# Patient Record
Sex: Male | Born: 1975 | Race: White | Hispanic: No | Marital: Single | Smoking: Current every day smoker
Health system: Southern US, Community
[De-identification: ages and names within clinical notes are randomized; demographics above are authoritative.]

## PROBLEM LIST (undated history)

## (undated) DIAGNOSIS — I10 Essential (primary) hypertension: Secondary | ICD-10-CM

## (undated) DIAGNOSIS — L97529 Non-pressure chronic ulcer of other part of left foot with unspecified severity: Secondary | ICD-10-CM

## (undated) DIAGNOSIS — K589 Irritable bowel syndrome without diarrhea: Secondary | ICD-10-CM

## (undated) HISTORY — PX: APPENDECTOMY: SHX54

---

## 2001-07-27 ENCOUNTER — Emergency Department (HOSPITAL_COMMUNITY): Admission: EM | Admit: 2001-07-27 | Discharge: 2001-07-27 | Payer: Self-pay | Admitting: Emergency Medicine

## 2001-08-19 ENCOUNTER — Emergency Department (HOSPITAL_COMMUNITY): Admission: EM | Admit: 2001-08-19 | Discharge: 2001-08-19 | Payer: Self-pay | Admitting: *Deleted

## 2001-10-09 ENCOUNTER — Emergency Department (HOSPITAL_COMMUNITY): Admission: EM | Admit: 2001-10-09 | Discharge: 2001-10-09 | Payer: Self-pay | Admitting: Emergency Medicine

## 2002-12-19 ENCOUNTER — Encounter: Payer: Self-pay | Admitting: *Deleted

## 2002-12-19 ENCOUNTER — Emergency Department (HOSPITAL_COMMUNITY): Admission: EM | Admit: 2002-12-19 | Discharge: 2002-12-19 | Payer: Self-pay | Admitting: *Deleted

## 2008-02-23 ENCOUNTER — Emergency Department (HOSPITAL_COMMUNITY): Admission: EM | Admit: 2008-02-23 | Discharge: 2008-02-23 | Payer: Self-pay | Admitting: Emergency Medicine

## 2009-04-05 ENCOUNTER — Emergency Department (HOSPITAL_COMMUNITY): Admission: EM | Admit: 2009-04-05 | Discharge: 2009-04-05 | Payer: Self-pay | Admitting: Emergency Medicine

## 2009-12-24 ENCOUNTER — Emergency Department (HOSPITAL_COMMUNITY): Admission: EM | Admit: 2009-12-24 | Discharge: 2009-12-24 | Payer: Self-pay | Admitting: Emergency Medicine

## 2010-02-20 ENCOUNTER — Emergency Department (HOSPITAL_COMMUNITY): Admission: EM | Admit: 2010-02-20 | Discharge: 2010-02-20 | Payer: Self-pay | Admitting: Emergency Medicine

## 2010-11-27 ENCOUNTER — Emergency Department (HOSPITAL_COMMUNITY)
Admission: EM | Admit: 2010-11-27 | Discharge: 2010-11-27 | Payer: Self-pay | Source: Home / Self Care | Admitting: Emergency Medicine

## 2010-12-04 ENCOUNTER — Emergency Department (HOSPITAL_COMMUNITY)
Admission: EM | Admit: 2010-12-04 | Discharge: 2010-12-04 | Payer: Self-pay | Source: Home / Self Care | Admitting: Emergency Medicine

## 2010-12-11 ENCOUNTER — Emergency Department (HOSPITAL_COMMUNITY)
Admission: EM | Admit: 2010-12-11 | Discharge: 2010-12-11 | Payer: Self-pay | Source: Home / Self Care | Admitting: Emergency Medicine

## 2010-12-30 ENCOUNTER — Emergency Department (HOSPITAL_COMMUNITY)
Admission: EM | Admit: 2010-12-30 | Discharge: 2010-12-30 | Disposition: A | Discharge status: Home / Self Care | Payer: Self-pay | Attending: Emergency Medicine | Admitting: Emergency Medicine

## 2010-12-30 DIAGNOSIS — J019 Acute sinusitis, unspecified: Secondary | ICD-10-CM | POA: Insufficient documentation

## 2011-09-06 ENCOUNTER — Ambulatory Visit (HOSPITAL_COMMUNITY)
Admission: RE | Admit: 2011-09-06 | Discharge: 2011-09-06 | Disposition: A | Discharge status: Home / Self Care | Payer: Self-pay | Source: Ambulatory Visit | Attending: Family Medicine | Admitting: Family Medicine

## 2011-09-06 ENCOUNTER — Other Ambulatory Visit (HOSPITAL_COMMUNITY): Payer: Self-pay | Admitting: Family Medicine

## 2011-09-06 DIAGNOSIS — R159 Full incontinence of feces: Secondary | ICD-10-CM

## 2011-09-06 DIAGNOSIS — M545 Low back pain, unspecified: Secondary | ICD-10-CM | POA: Insufficient documentation

## 2011-09-06 DIAGNOSIS — M549 Dorsalgia, unspecified: Secondary | ICD-10-CM

## 2011-09-06 DIAGNOSIS — G8929 Other chronic pain: Secondary | ICD-10-CM

## 2013-05-24 ENCOUNTER — Emergency Department (HOSPITAL_COMMUNITY)
Admission: EM | Admit: 2013-05-24 | Discharge: 2013-05-24 | Discharge status: Against Medical Advice | Payer: Self-pay | Attending: Emergency Medicine | Admitting: Emergency Medicine

## 2013-05-24 ENCOUNTER — Encounter (HOSPITAL_COMMUNITY): Payer: Self-pay

## 2013-05-24 DIAGNOSIS — H538 Other visual disturbances: Secondary | ICD-10-CM | POA: Insufficient documentation

## 2013-05-24 DIAGNOSIS — F17200 Nicotine dependence, unspecified, uncomplicated: Secondary | ICD-10-CM | POA: Insufficient documentation

## 2013-05-24 DIAGNOSIS — H9210 Otorrhea, unspecified ear: Secondary | ICD-10-CM | POA: Insufficient documentation

## 2013-05-24 DIAGNOSIS — R51 Headache: Secondary | ICD-10-CM | POA: Insufficient documentation

## 2013-05-24 DIAGNOSIS — H9209 Otalgia, unspecified ear: Secondary | ICD-10-CM | POA: Insufficient documentation

## 2013-05-24 NOTE — ED Notes (Signed)
Pain and pressure on left side of head, also the same side with ear drainage for several days.

## 2013-05-24 NOTE — ED Notes (Signed)
Pt passed me in the hallway and stated "I can't wait any longer"  And walked out the e.d. Entrance.

## 2013-05-24 NOTE — ED Notes (Signed)
Pt reports head pressure & drainage from the left ear. Pt states his vision has been blurry also.

## 2013-08-20 ENCOUNTER — Emergency Department (HOSPITAL_COMMUNITY): Payer: Self-pay

## 2013-08-20 ENCOUNTER — Encounter (HOSPITAL_COMMUNITY): Payer: Self-pay | Admitting: *Deleted

## 2013-08-20 ENCOUNTER — Emergency Department (HOSPITAL_COMMUNITY)
Admission: EM | Admit: 2013-08-20 | Discharge: 2013-08-20 | Disposition: A | Discharge status: Home / Self Care | Payer: Self-pay | Attending: Emergency Medicine | Admitting: Emergency Medicine

## 2013-08-20 DIAGNOSIS — E13621 Other specified diabetes mellitus with foot ulcer: Secondary | ICD-10-CM

## 2013-08-20 DIAGNOSIS — F17200 Nicotine dependence, unspecified, uncomplicated: Secondary | ICD-10-CM | POA: Insufficient documentation

## 2013-08-20 DIAGNOSIS — E119 Type 2 diabetes mellitus without complications: Secondary | ICD-10-CM

## 2013-08-20 DIAGNOSIS — E1169 Type 2 diabetes mellitus with other specified complication: Secondary | ICD-10-CM | POA: Insufficient documentation

## 2013-08-20 DIAGNOSIS — L97509 Non-pressure chronic ulcer of other part of unspecified foot with unspecified severity: Secondary | ICD-10-CM | POA: Insufficient documentation

## 2013-08-20 LAB — CBC WITH DIFFERENTIAL/PLATELET
Eosinophils Relative: 2 % (ref 0–5)
HCT: 45.6 % (ref 39.0–52.0)
Hemoglobin: 16.4 g/dL (ref 13.0–17.0)
Lymphocytes Relative: 27 % (ref 12–46)
MCHC: 36 g/dL (ref 30.0–36.0)
MCV: 92.3 fL (ref 78.0–100.0)
Monocytes Absolute: 0.9 10*3/uL (ref 0.1–1.0)
Monocytes Relative: 11 % (ref 3–12)
Neutro Abs: 4.4 10*3/uL (ref 1.7–7.7)
WBC: 7.6 10*3/uL (ref 4.0–10.5)

## 2013-08-20 LAB — GLUCOSE, CAPILLARY: Glucose-Capillary: 252 mg/dL — ABNORMAL HIGH (ref 70–99)

## 2013-08-20 LAB — BASIC METABOLIC PANEL
BUN: 9 mg/dL (ref 6–23)
CO2: 25 mEq/L (ref 19–32)
Chloride: 100 mEq/L (ref 96–112)
Creatinine, Ser: 0.7 mg/dL (ref 0.50–1.35)

## 2013-08-20 MED ORDER — METFORMIN HCL 500 MG PO TABS: 500.0000 mg | ORAL_TABLET | Freq: Two times a day (BID) | ORAL | Status: DC

## 2013-08-20 MED ORDER — SULFAMETHOXAZOLE-TRIMETHOPRIM 800-160 MG PO TABS: 1.0000 | ORAL_TABLET | Freq: Two times a day (BID) | ORAL | Status: AC

## 2013-08-20 NOTE — ED Notes (Signed)
Pt set up for wound care and info faxed to PT.  Given diabetic info for  Classes.  Foot wrapped and placed in  Post op shoe.

## 2013-08-20 NOTE — Discharge Instructions (Signed)
Diabetes and Foot Care Diabetes may cause you to have a poor blood supply (circulation) to your legs and feet. Because of this, the skin may be thinner, break easier, and heal more slowly. You also may have nerve damage in your legs and feet causing decreased feeling. You may not notice minor injuries to your feet that could lead to serious problems or infections. Taking care of your feet is one of the most important things you can do for yourself.  HOME CARE INSTRUCTIONS  Do not go barefoot. Bare feet are easily injured.  Check your feet daily for blisters, cuts, and redness.  Wash your feet with warm water (not hot) and mild soap. Pat your feet and between your toes until completely dry.  Apply a moisturizing lotion that does not contain alcohol or petroleum jelly to the dry skin on your feet and to dry brittle toenails. Do not put it between your toes.  Trim your toenails straight across. Do not dig under them or around the cuticle.  Do not cut corns or calluses, or try to remove them with medicine.  Wear clean cotton socks or stockings every day. Make sure they are not too tight. Do not wear knee high stockings since they may decrease blood flow to your legs.  Wear leather shoes that fit properly and have enough cushioning. To break in new shoes, wear them just a few hours a day to avoid injuring your feet.  Wear shoes at all times, even in the house.  Do not cross your legs. This may decrease the blood flow to your feet.  If you find a minor scrape, cut, or break in the skin on your feet, keep it and the skin around it clean and dry. These areas may be cleansed with mild soap and water. Do not use peroxide, alcohol, iodine or Merthiolate.  When you remove an adhesive bandage, be sure not to harm the skin around it.  If you have a wound, look at it several times a day to make sure it is healing.  Do not use heating pads or hot water bottles. Burns can occur. If you have lost feeling  in your feet or legs, you may not know it is happening until it is too late.  Report any cuts, sores or bruises to your caregiver. Do not wait! SEEK MEDICAL CARE IF:   You have an injury that is not healing or you notice redness, numbness, burning, or tingling.  Your feet always feel cold.  You have pain or cramps in your legs and feet. SEEK IMMEDIATE MEDICAL CARE IF:   There is increasing redness, swelling, or increasing pain in the wound.  There is a red line that goes up your leg.  Pus is coming from a wound.  You develop an unexplained oral temperature above 102 F (38.9 C), or as your caregiver suggests.  You notice a bad smell coming from an ulcer or wound. MAKE SURE YOU:   Understand these instructions.  Will watch your condition.  Will get help right away if you are not doing well or get worse. Document Released: 10/29/2000 Document Revised: 01/24/2012 Document Reviewed: 05/07/2009 [facility] Patient Information 2014 [place], [place]. Wound Care Wound care helps prevent pain and infection.  You may need a tetanus shot if:  You cannot remember when you had your last tetanus shot.  You have never had a tetanus shot.  The injury broke your skin. If you need a tetanus shot and you choose  not to have one, you may get tetanus. Sickness from tetanus can be serious. HOME CARE   Only take medicine as told by your doctor.  Clean the wound daily with mild soap and water.  Change any bandages (dressings) as told by your doctor.  Put medicated cream and a bandage on the wound as told by your doctor.  Change the bandage if it gets wet, dirty, or starts to smell.  Take showers. Do not take baths, swim, or do anything that puts your wound under water.  Rest and raise (elevate) the wound until the pain and puffiness (swelling) are better.  Keep all doctor visits as told. GET HELP RIGHT AWAY IF:   Yellowish-white fluid (pus) comes from the wound.  Medicine does not  lessen your pain.  There is a red streak going away from the wound.  You have a fever. MAKE SURE YOU:   Understand these instructions.  Will watch your condition.  Will get help right away if you are not doing well or get worse. Document Released: 08/10/2008 Document Revised: 01/24/2012 Document Reviewed: 03/07/2011 [facility] Patient Information 2014 [place], [place]. Diabetes, Frequently Asked Questions WHAT IS DIABETES? Most of the food we eat is turned into glucose (sugar). Our bodies use it for energy. The pancreas makes a hormone called insulin. It helps glucose get into the cells of our bodies. When you have diabetes, your body either does not make enough insulin or cannot use its own insulin as well as it should. This causes sugars to build up in your blood. WHAT ARE THE SYMPTOMS OF DIABETES?  Frequent urination.  Excessive thirst.  Unexplained weight loss.  Extreme hunger.  Blurred vision.  Tingling or numbness in hands or feet.  Feeling very tired much of the time.  Dry, itchy skin.  Sores that are slow to heal.  Yeast infections. WHAT ARE THE TYPES OF DIABETES? Type 1 Diabetes   About 10% of affected people have this type.  Usually occurs before the age of 61.  Usually occurs in thin to normal weight people. Type 2 Diabetes  About 90% of affected people have this type.  Usually occurs after the age of 28.  Usually occurs in overweight people.  More likely to have:  A family history of diabetes.  A history of diabetes during pregnancy (gestational diabetes).  High blood pressure.  High cholesterol and triglycerides. Gestational Diabetes  Occurs in about 4% of pregnancies.  Usually goes away after the baby is born.  More likely to occur in women with:  Family history of diabetes.  Previous gestational diabetes.  Obese.  Over 6 years old. WHAT IS PRE-DIABETES? Pre-diabetes means your blood glucose is higher than normal, but lower  than the diabetes range. It also means you are at risk of getting type 2 diabetes and heart disease. If you are told you have pre-diabetes, have your blood glucose checked again in 1 to 2 years. WHAT IS THE TREATMENT FOR DIABETES? Treatment is aimed at keeping blood glucose near normal levels at all times. Learning how to manage this yourself is important in treating diabetes. Depending on the type of diabetes you have, your treatment will include one or more of the following:  Monitoring your blood glucose.  Meal planning.  Exercise.  Oral medicine (pills) or insulin. CAN DIABETES BE PREVENTED? With type 1 diabetes, prevention is more difficult, because the triggers that cause it are not yet known. With type 2 diabetes, prevention is more likely, with lifestyle  changes:  Maintain a healthy weight.  Eat healthy.  Exercise. IS THERE A CURE FOR DIABETES? No, there is no cure for diabetes. There is a lot of research going on that is looking for a cure, and progress is being made. Diabetes can be treated and controlled. People with diabetes can manage their diabetes and lead normal, active lives. SHOULD I BE TESTED FOR DIABETES? If you are at least 35 years old, you should be tested for diabetes. You should be tested again every 3 years. If you are 45 or older and overweight, you may want to get tested more often. If you are younger than 45, overweight, and have one or more of the following risk factors, you should be tested:  Family history of diabetes.  Inactive lifestyle.  High blood pressure. WHAT ARE SOME OTHER SOURCES FOR INFORMATION ON DIABETES? The following organizations may help in your search for more information on diabetes: National Diabetes Education Program (NDEP) Internet: SolarDiscussions.es American Diabetes Association Internet: http://www.diabetes.org  Juvenile Diabetes Foundation International Internet: WetlessWash.is Document Released:  11/04/2003 Document Revised: 01/24/2012 Document Reviewed: 08/29/2009 [facility] Patient Information 2014 [place], [place].

## 2013-08-20 NOTE — ED Notes (Signed)
Pt has wound to ball of lt foot , started as a blister, white around it and area is at least 1/4 " deep.

## 2013-08-20 NOTE — ED Provider Notes (Signed)
CSN: [id number]     Arrival date & time 08/20/13  1806 History   First MD Initiated Contact with Patient 08/20/13 1855     Chief Complaint  Patient presents with  . Wound Check   (Consider location/radiation/quality/duration/timing/severity/associated sxs/prior Treatment) Patient is a 37 y.o. male presenting with foot injury. The history is provided by the patient. No language interpreter was used.  Foot Injury Location:  Foot Time since incident:  3 weeks Injury: no   Foot location:  L foot Pain details:    Quality:  Aching   Radiates to:  Does not radiate   Severity:  Mild   Duration:  3 weeks   Timing:  Constant Chronicity:  New Dislocation: no   Foreign body present:  No foreign bodies Relieved by:  Nothing Worsened by:  Nothing tried Ineffective treatments:  None tried Pt reports he developed a blister on his foot and he now has an area that is not healing.  History reviewed. No pertinent past medical history. Past Surgical History  Procedure Laterality Date  . Appendectomy     No family history on file. History  Substance Use Topics  . Smoking status: Current Every Day Smoker  . Smokeless tobacco: Not on file  . Alcohol Use: Yes    Review of Systems  All other systems reviewed and are negative.    Allergies  Review of patient's allergies indicates no known allergies.  Home Medications   Current Outpatient Rx  Name  Route  Sig  Dispense  Refill  . ibuprofen (ADVIL,MOTRIN) 200 MG tablet   Oral   Take 400 mg by mouth every 6 (six) hours as needed for pain.           BP 140/89  Pulse 89  Temp(Src) 98.2 F (36.8 C) (Oral)  Resp 18  SpO2 99% Physical Exam  Vitals reviewed. Constitutional: He is oriented to person, place, and time. He appears well-developed and well-nourished.  HENT:  Head: Normocephalic.  Eyes: Pupils are equal, round, and reactive to light.  Cardiovascular: Normal rate.   Pulmonary/Chest: Effort normal.  Musculoskeletal: He  exhibits tenderness.  2cm round discolored area left foot below 1st toe,  No drainage, deep area approx 1cm in center,  (looks like diabetic ulcer)  Neurological: He is alert and oriented to person, place, and time.  Skin: No rash noted. No erythema.  Psychiatric: He has a normal mood and affect.    ED Course  Procedures (including critical care time) Labs Review Labs Reviewed  GLUCOSE, CAPILLARY - Abnormal; Notable for the following:    Glucose-Capillary 252 (*)    All other components within normal limits  CBC WITH DIFFERENTIAL  BASIC METABOLIC PANEL   Imaging Review Dg Foot Complete Left  08/20/2013   CLINICAL DATA:  Soft tissue ulcer on the ball of the foot.  EXAM: LEFT FOOT - COMPLETE 3+ VIEW  COMPARISON:  None.  FINDINGS: There is a hallux valgus deformity with bunion formation on the head of the 1st metatarsal. The bones are otherwise normal. There is no evidence of osteomyelitis or other acute osseous abnormality.  IMPRESSION: No acute osseous abnormality.   Electronically Signed   By: Geanie Cooley M.D.   On: 08/20/2013 19:28    MDM   1. 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 Diabetes mellitus     Pt reports no history of diabetes,  Labs show glucose of 279.   Pt started on metformin,  rx for bactrim,  Pt referred to wound care center.   Pt given referrals to primary MDs and clinics.   Wound care and post op shoe here   Elson Areas, PA-C 08/20/13 2049

## 2013-08-20 NOTE — ED Notes (Signed)
Pt states wound to bottom of left foot x ~ 3 weeks. States it first appeared as a blister and has been draining

## 2013-08-22 ENCOUNTER — Ambulatory Visit (HOSPITAL_COMMUNITY)
Admission: RE | Admit: 2013-08-22 | Discharge: 2013-08-22 | Disposition: A | Discharge status: Home / Self Care | Payer: Self-pay | Source: Ambulatory Visit | Attending: Emergency Medicine | Admitting: Emergency Medicine

## 2013-08-22 DIAGNOSIS — IMO0001 Reserved for inherently not codable concepts without codable children: Secondary | ICD-10-CM | POA: Insufficient documentation

## 2013-08-22 DIAGNOSIS — S91109A Unspecified open wound of unspecified toe(s) without damage to nail, initial encounter: Secondary | ICD-10-CM | POA: Insufficient documentation

## 2013-08-22 DIAGNOSIS — E1169 Type 2 diabetes mellitus with other specified complication: Secondary | ICD-10-CM | POA: Insufficient documentation

## 2013-08-22 DIAGNOSIS — T148XXA Other injury of unspecified body region, initial encounter: Secondary | ICD-10-CM | POA: Insufficient documentation

## 2013-08-22 DIAGNOSIS — L98492 Non-pressure chronic ulcer of skin of other sites with fat layer exposed: Secondary | ICD-10-CM

## 2013-08-22 NOTE — Progress Notes (Addendum)
Physical Therapy Evaluation  Patient Details  Name: Jeffery Robertson MRN: [id number] Date of Birth: Jan 22, 1976  Today's Date: 08/22/2013 Time: 7829-5621 PT Time Calculation (min): 45 min Charge:  evaluation             Visit#: 1 of 8  Re-eval: 09/21/13    Authorization: no insurance     Past Medical History: No past medical history on file. Past Surgical History:  Past Surgical History  Procedure Laterality Date  . Appendectomy      Subjective   Pt states he had a blister to come up on his foot that turned into a hole.  He was not going to go to the ER but was urged by family members.  Pt states he found out that he was diabetic and the MD referred him to therapy for wound care.        Problem List Patient Active Problem List   Diagnosis Date Noted  . Nonhealing nonsurgical wound 08/22/2013      Physical Therapy Wound Care Evaluation  Referring physician/Next Apt:  [place] [facility] Diagnosis:  Nonhealing diabetic wound Previous Therapy:none Current wound care treatment: Self dressing change Subjective: When did it begin: 3 weeks ago  Is patient agreeable to wound care? yes   Pain:  minimal Objective:   Wound 1:   Location:  Plantar aspect of 3rd metatarsal Length: .5 cm Width: .5 cm Depth: .6 cm Undermining: varies from .5-1.2 but undermining in all directions. Granulation 50% Slough: 50% Other: callous halo around wound that is .5 cm  Drainage (amount/description) Min serous  Dressing Used: honey; 2x2; kelix and coban Dressing: Sharps Used/Location: forcep  Remove: slough Pt given Darco boot to keep weight off of wound. PT Assessment and Plan Clinical Impression Statement: Pt is a newly diabetic who has a non-healing wound on plantar aspect of 3rd metatarsal head. Pt will benefit from skilled therapeutic intervention in order to improve on the following deficits: Other (comment) (nonhealing wound) Rehab Potential: Good PT Frequency: Min 2X/week PT  Duration: 4 weeks PT Treatment/Interventions:  (debridement dressing change) PT Plan: see for cleansing; debridement and dressing change Pt will benefit from skilled OP PT wound care to address current impairments.  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 Plan: Selective sharps debridement, Dressing Change, , Pt/family Education  Frequency/Duration:  ___2___x/week for __4___weeks.  PT Prognosis Wound Therapy - Potential for Goals: Excellent Good Fair Poor  Goals: Wound Therapy Goals - Improve the function of patient's integumentary system by progressing the wound(s) through the phases of wound healing by:  Decrease Necrotic Tissue to: 0  Decrease Necrotic Tissue - Progress: Goal set today  Increase Granulation Tissue to: 100  Increase Granulation Tissue - Progress: Goal set today  Decrease Length/Width/Depth by (cm):   .3x.3.x.5 with no undermining Decrease Length/Width/Depth - Progress: Goal set today  Improve Drainage Characteristics: scant Improve Drainage Characteristics - Progress: Goal set today  Patient/Family will be able to : I in dressing change Patient/Family Instruction Goal - Progress: Goal set today   Goals/treatment plan/discharge plan were made with and agreed upon by patient/family: Yes   Problem List Patient Active Problem List   Diagnosis Date Noted  . Nonhealing nonsurgical wound 08/22/2013

## 2013-08-22 NOTE — ED Provider Notes (Signed)
Medical screening examination/treatment/procedure(s) were performed by non-physician practitioner and as supervising physician I was immediately available for consultation/collaboration.   Benny Lennert, MD 08/22/13 [phone]

## 2013-08-24 ENCOUNTER — Ambulatory Visit (HOSPITAL_COMMUNITY)
Admission: RE | Admit: 2013-08-24 | Discharge: 2013-08-24 | Disposition: A | Discharge status: Home / Self Care | Payer: Self-pay | Source: Ambulatory Visit | Attending: Emergency Medicine | Admitting: Emergency Medicine

## 2013-08-24 DIAGNOSIS — L98492 Non-pressure chronic ulcer of skin of other sites with fat layer exposed: Secondary | ICD-10-CM

## 2013-08-24 NOTE — Progress Notes (Signed)
Physical Therapy Treatment Patient Details  Name: Jeffery Robertson MRN: [id number] Date of Birth: [birth year]  Today's Date: 08/24/2013 Time: 1150-1235 PT Time Calculation (min): 45 min  Visit#: 2 of 8  Re-eval: 09/21/13    Authorization: no insurance   Problem List Patient Active Problem List   Diagnosis Date Noted  . Nonhealing nonsurgical wound 08/22/2013      Physical Therapy Wound Care Treatment  Referring physician/Next Apt: unknown Medical Diagnosis: diabetic wound Previous Therapy: none  Subjective: It feels like my wound is healing; it's itching.  Pt states he is still getting use to the Darco boot  Is patient agreeable to wound care?  yes  Pain: none Objective:  Wound 1: Location: plantar aspect of Lt foot     Length: 1.0 x .7 wound size increasing as callous is being debrided Depth: 1.0 Width: .7 Granulation: 70% Slough: 30% Drainage (amount/description): min serous   Sharps Used/Location:   Remove: forcep; sissors  Clinical  Impressions Statement: Wound appears healthier but will be getting larger as expected from undermining and callous.    Plan:  Continue with debridement and unweighting of LE   Donnamae Jude CLT, PT  [phone]

## 2013-08-28 ENCOUNTER — Ambulatory Visit (HOSPITAL_COMMUNITY)
Admission: RE | Admit: 2013-08-28 | Discharge: 2013-08-28 | Disposition: A | Discharge status: Home / Self Care | Payer: Self-pay | Source: Ambulatory Visit | Attending: Emergency Medicine | Admitting: Emergency Medicine

## 2013-08-28 NOTE — Progress Notes (Addendum)
  Patient Details  Name: Jeffery Robertson MRN: [id number] Date of Birth: 08-Feb-1976  Today's Date: 08/28/2013 Time: 1914-7829 Visit#: 2 of 8 Re-eval: 09/21/13 Charges: Selective debridement (= or < 20 cm)  Physical Therapy Wound Care Treatment    Subjective: Pt is pain free and without complaint.  Is patient agreeable to wound care? Yes  Pain: 0/10 Charges: Selective debridement (= or < 20 cm)  Physical Therapy Wound Care Treatment    Subjective: Pt is pain free and without complaint.  Is patient agreeable to wound care? Yes  Pain: 0/10 Objective:  Wound 1:  Location: plantar aspect of Lt foot  Length: 1.0 x .7 (1010/14) Depth: 1.0 (1010/14) Width: .7 (1010/14) Granulation: 80%  Slough: 20%  Drainage (amount/description): min serous  Dressing type: Honey gel/gauze, ABD, Kerelx, Coban  Sharps Used/Location: forceps and scalpel/ 3rd metatarsal head of left foot  Remove: callous and slough   Clinical  Impressions Statement: Able to remove significant amount of slough and callous. Pt tolerates debridement well. Wound is without signs or sx of infection.     Plan:  Continue wound care per PT POC.    Seth Bake, PTA  08/28/2013, 4:59 PM

## 2013-08-30 ENCOUNTER — Ambulatory Visit (HOSPITAL_COMMUNITY)
Admission: RE | Admit: 2013-08-30 | Discharge: 2013-08-30 | Disposition: A | Discharge status: Home / Self Care | Payer: Self-pay | Source: Ambulatory Visit | Attending: Emergency Medicine | Admitting: Emergency Medicine

## 2013-08-30 NOTE — Progress Notes (Signed)
Physical Therapy Treatment Patient Details  Name: Jeffery Robertson MRN: [id number] Date of Birth: [birth year]  Today's Date: 08/30/2013 Time: 1430-1453 PT Time Calculation (min): 23 min  Visit#: 4 of 8  Re-eval: 09/21/13  Authorization: no insurance  Charges: Selective debridement (= or < 20 cm)  Physical Therapy Wound Care Treatment    Subjective: Pt is pain free and without complaint.  Is patient agreeable to wound care? Yes  Pain: 0/10 Objective:  Location: plantar aspect of Lt foot  Length: 1.0 x .7 (1010/14) Depth: 1.0 (1010/14) Width: .7 (1010/14) Granulation: 80%  Slough: 20%  Drainage (amount/description): min serous Dressing type: Honey gel/gauze, ABD, Kerelx, Coban Sharps Used/Location: forceps and scalpel/ 3rd metatarsal head of left foot Tissue Removed: callous and slough   Clinical  Impressions Statement:  Wound presents with increase granulation and less callous. Able remove significant amount of callous from wound perimeter. Pt tolerates debridement well. Wound is without signs or sx of infection.     Plan:  Continue wound care per PT POC.    Seth Bake, PTA Seth Bake Leah 08/30/2013, 3:02 PM

## 2013-09-04 ENCOUNTER — Ambulatory Visit (HOSPITAL_COMMUNITY)
Admission: RE | Admit: 2013-09-04 | Discharge: 2013-09-04 | Disposition: A | Discharge status: Home / Self Care | Payer: Self-pay | Source: Ambulatory Visit | Attending: Physical Therapy | Admitting: Physical Therapy

## 2013-09-04 DIAGNOSIS — L98492 Non-pressure chronic ulcer of skin of other sites with fat layer exposed: Secondary | ICD-10-CM

## 2013-09-04 NOTE — Progress Notes (Signed)
  Patient Details  Name: JOHNATHAN HESKETT MRM: [id number] Date of Birth: January 04, 1976  Today's Date: 09/04/2013 Time:1430  - 1452   Visit#: 5 of 8 Re-ev al: 09/21/13 Physical Therapy Wound Care Treatment  Referring physician/Next Apt:  Medical Diagnosis: diabetic non-healing wound Previous Therapy:  none Subjective:  Pt does not have anything to check his blood sugar with; pt has appointment at health department on Thursday explained to pt to let health department know that he is unable to check blood sugar and how important this is.  Is patient agreeable to wound care? Yes   Pain: none  Objective:  Wound 1  Location:  Plantar aspect of Lt 3rd metatarsal head.     Length: .7 cm Depth:  Generally 1.0 cm  But has a small area that increases to 1.5 cm  Width: .5 cm Undermining: wound undermines throughout circumference generally the size of  The callous   Granulation:  100% after debridement Slough: 0% after debridement. Drainage (amount/description): serous minimal Peri wound: calloused  Dressing Type: honey ; 2x2 gauze; Kling and coban. Sharps Used/Location: forceps; scissors Tissue Remove: slouth Clinical  Impressions Statement: Pt wound is healing more from outside than in will need to keep wound open as long as there is undermining.  Pt wound is much cleaner may be able to go down to once a week starting next week.    Plan:  Continue with wound care; decrease to once a weak  RUSSELL,CINDY 09/04/2013, 3:26 PM

## 2013-09-06 ENCOUNTER — Ambulatory Visit (HOSPITAL_COMMUNITY)
Admission: RE | Admit: 2013-09-06 | Discharge: 2013-09-06 | Disposition: A | Discharge status: Home / Self Care | Payer: Self-pay | Source: Ambulatory Visit | Attending: Emergency Medicine | Admitting: Emergency Medicine

## 2013-09-06 NOTE — Progress Notes (Signed)
Physical Therapy Treatment Patient Details  Name: Jeffery Robertson MRN: [id number] Date of Birth: June 01, 1976  Today's Date: 09/06/2013 Time: 4098-1191 PT Time Calculation (min): 18 min  Visit#: 6 of 8  Re-eval: 09/21/13  Authorization: no insurance  Charges: Selective debridement (= or < 20 cm)  Physical Therapy Wound Care Treatment  Subjective: Pt states that compression wrap felt too tight after last session. Is patient agreeable to wound care? Yes  Pain: 0/10  Objective:  Location: plantar aspect of Lt foot  Length: 1.0 x .7 (1010/14)  Depth: 1.0 (1010/14)  Width: .7 (1010/14)  Granulation: 80%  Slough: 20%  Drainage (amount/description): min serous  Dressing type: Honey gel/gauze, ABD, Kerelx, Coban  Sharps Used/Location: forceps and scalpel/ 3rd metatarsal head of left foot  Tissue Removed: callous and slough  Clinical Impressions Statement: Wound continues to progress gradually. Pt is agreeable to wound care once a week rather than twice. Able to remove good amount of slough and callous this session. Pt toelrates debridement well. Plan: Continue wound care per PT POC.     Seth Bake, PTA  09/06/2013, 3:38 PM

## 2013-09-11 ENCOUNTER — Ambulatory Visit (HOSPITAL_COMMUNITY)
Admission: RE | Admit: 2013-09-11 | Discharge: 2013-09-11 | Disposition: A | Discharge status: Home / Self Care | Payer: Self-pay | Source: Ambulatory Visit | Attending: Emergency Medicine | Admitting: Emergency Medicine

## 2013-09-11 NOTE — Progress Notes (Signed)
Physical Therapy Treatment Patient Details  Name: Jeffery Robertson MRN: [id number] Date of Birth: 10/12/1976  Today's Date: 09/11/2013 Time: 1430-1500 PT Time Calculation (min): 30 min Charges: Selective debridement (= or < 20 cm)   Visit#: 7 of 8  Re-eval: 09/21/13  Authorization: no insurance  Physical Therapy Wound Care Treatment  Subjective: Pt states that his dressing got wet in the shower. Is patient agreeable to wound care? Yes  Pain: 0/10  Objective:  Location: plantar aspect of Lt foot  Length: 1.0 x .7 (1010/14)  Depth: 1.0 (1010/14)  Width: .7 (1010/14)  Granulation: 85%  Slough: 15%  Drainage (amount/description): min serous  Dressing type: Honey gel/gauze, ABD, Kerelx, Coban  Sharps Used/Location: forceps and scalpel/ 3rd metatarsal head of left foot  Tissue Removed: callous and slough  Clinical Impressions Statement:  Wound appears to be decreasing in size. Granulation has increased. Pt tolerates debridement well. Educated pt on importance of keeping dressing clean and dry.  Plan: Reassess next session.   Seth Bake, PTA 09/11/2013, 3:15 PM

## 2013-09-13 ENCOUNTER — Ambulatory Visit (HOSPITAL_COMMUNITY): Payer: Self-pay | Admitting: Physical Therapy

## 2013-09-19 ENCOUNTER — Ambulatory Visit (HOSPITAL_COMMUNITY)
Admission: RE | Admit: 2013-09-19 | Discharge: 2013-09-19 | Disposition: A | Discharge status: Home / Self Care | Payer: Self-pay | Source: Ambulatory Visit | Attending: Emergency Medicine | Admitting: Emergency Medicine

## 2013-09-19 DIAGNOSIS — S91109A Unspecified open wound of unspecified toe(s) without damage to nail, initial encounter: Secondary | ICD-10-CM | POA: Insufficient documentation

## 2013-09-19 DIAGNOSIS — IMO0001 Reserved for inherently not codable concepts without codable children: Secondary | ICD-10-CM | POA: Insufficient documentation

## 2013-09-19 DIAGNOSIS — L98492 Non-pressure chronic ulcer of skin of other sites with fat layer exposed: Secondary | ICD-10-CM

## 2013-09-19 DIAGNOSIS — E1169 Type 2 diabetes mellitus with other specified complication: Secondary | ICD-10-CM | POA: Insufficient documentation

## 2013-09-19 NOTE — Progress Notes (Signed)
  Patient Details  Name: Jeffery Robertson MRN: [id number] Date of Birth: 01-Dec-1975  Today's Date: 09/19/2013 Time: 1440 - 1537  charge debridement Visit#: 8 of 8 Re-eval: 09/21/13 Subjective: Pt states that his dressing got wet in the shower.  Is patient agreeable to wound care? Yes  Pain: 0/10  Objective:  Location: plantar aspect of Lt foot  Length: .7 x was 1.0  Depth: .2 was 1.0 Width: .4 was .7 (1010/14)  Granulation: 95%  Slough: 05%  Drainage (amount/description): min serous  Dressing type:  Vaseline; 4x4;kling and netting Sharps Used/Location: forceps and scissors 3rd metatarsal head of left foot  Tissue Removed: callous and slough  Clinical Impressions Statement: Wound appears to be decreasing in size. Granulation has increased. Pt tolerates debridement well. Educated pt on importance of keeping dressing clean and dry.  Plan: see two more visits     Taytum Scheck,CINDY 09/19/2013, 5:16 PM

## 2013-09-26 ENCOUNTER — Ambulatory Visit (HOSPITAL_COMMUNITY)
Admission: RE | Admit: 2013-09-26 | Discharge: 2013-09-26 | Disposition: A | Discharge status: Home / Self Care | Payer: Self-pay | Source: Ambulatory Visit | Attending: Emergency Medicine | Admitting: Emergency Medicine

## 2013-09-26 NOTE — Progress Notes (Signed)
Physical Therapy Treatment Patient Details  Name: Jeffery Robertson MRN: [id number] Date of Birth: 01/03/1976  Today's Date: 09/26/2013 Time: 1445-1510 PT Time Calculation (min): 25 min Visit#: 9 of 10  Re-eval: 10/03/13 Charges:  Debridement < 20cm    Subjective: Pt states he is changing it daily and wearing his boot.  Is patient agreeable to wound care? Yes   Pain: 0/10  Objective:  Location: plantar aspect of Lt foot  Length: 0.7 cm (on 11/5) Depth: 0.2 cm ( on 11/5) Width: 0.4 cm (on 11/5)  Granulation: 95%  Slough: 05%  Drainage (amount/description):   none Dressing type: medihoney gel on 2X2, 3" conform,  netting  Sharps Used/Location: forceps and scissors 3rd metatarsal head of left foot  Tissue Removed: callous  Physical Therapy Assessment and Plan PT Assessment and Plan Clinical Impression Statement: Continues to build callous around wound preventing full closure.  Again debrided callous away to expose wound borders.  No drainage or slough in remaining wound bed.  Applied honeygel and 2X2, wrapped with conform and netting.  Pt instructed to continue wearing off loading boot to help with healing.  PT Plan: continue to see one time a week; per evaluating PT, anticipate discharge next visit.       Lurena Nida, PTA/CLT 09/26/2013, 6:33 PM

## 2013-10-03 ENCOUNTER — Ambulatory Visit (HOSPITAL_COMMUNITY)
Admission: RE | Admit: 2013-10-03 | Discharge: 2013-10-03 | Disposition: A | Discharge status: Home / Self Care | Payer: Self-pay | Source: Ambulatory Visit | Attending: Emergency Medicine | Admitting: Emergency Medicine

## 2013-10-03 NOTE — Progress Notes (Addendum)
Physical Therapy Treatment/Discharge NOTE Patient Details  Name: Jeffery Robertson MRN: [id number] Date of Birth: Nov 17, 1975  Today's Date: 10/03/2013 Time: 6295-2841 PT Time Calculation (min): 20 min Visit#: 10 of 10  Charges: Self care x 18'   Subjective: Symptoms/Limitations Symptoms: Pt states that he had a blister appear on left lateral foot from hi hunting boots.. Pain Assessment Currently in Pain?: No/denies  Precautions/Restrictions     Physical Therapy Wound Care Treatment  Subjective: Pt states that his dressing got wet in the shower.  Is patient agreeable to wound care? Yes  Pain: 0/10  Objective:  Location: plantar aspect of Lt foot  Length: Healed (measured 1.0 cm 1010/14)  Depth: Healed (measure 1.0 cm 1010/14)  Width: Healed (measured .7 cm 1010/14)    Clinical Impressions Statement: Wound on platar surface of left foot in now completely healed. Superficial blister noted in lateral side of left foot. Pt states that he got it while wearing his hunting boots. Area was cleansed. Wound was covered with saline soaked gauze and anchored with medipore. Advised pt to apply neosporin to area and keep covered with large band-aid. Advised pt to watch area for any changes and contact MD if wound does not heal properly.  Plan:  Recommend D/C.  Problem List Patient Active Problem List   Diagnosis Date Noted  . Nonhealing nonsurgical wound 08/22/2013    PT - End of Session Activity Tolerance: Patient tolerated treatment well General Behavior During Therapy: [facility] for tasks assessed/performed  Seth Bake, PTA  10/03/2013, 3:44 PM

## 2014-02-07 ENCOUNTER — Encounter (HOSPITAL_COMMUNITY): Payer: Self-pay | Admitting: Emergency Medicine

## 2014-02-07 ENCOUNTER — Emergency Department (HOSPITAL_COMMUNITY)
Admission: EM | Admit: 2014-02-07 | Discharge: 2014-02-08 | Disposition: A | Discharge status: Home / Self Care | Payer: Self-pay | Attending: Emergency Medicine | Admitting: Emergency Medicine

## 2014-02-07 DIAGNOSIS — Z79899 Other long term (current) drug therapy: Secondary | ICD-10-CM | POA: Insufficient documentation

## 2014-02-07 DIAGNOSIS — M25579 Pain in unspecified ankle and joints of unspecified foot: Secondary | ICD-10-CM | POA: Insufficient documentation

## 2014-02-07 DIAGNOSIS — I10 Essential (primary) hypertension: Secondary | ICD-10-CM | POA: Insufficient documentation

## 2014-02-07 DIAGNOSIS — L84 Corns and callosities: Secondary | ICD-10-CM | POA: Insufficient documentation

## 2014-02-07 DIAGNOSIS — M79672 Pain in left foot: Secondary | ICD-10-CM

## 2014-02-07 DIAGNOSIS — F17200 Nicotine dependence, unspecified, uncomplicated: Secondary | ICD-10-CM | POA: Insufficient documentation

## 2014-02-07 DIAGNOSIS — L97509 Non-pressure chronic ulcer of other part of unspecified foot with unspecified severity: Secondary | ICD-10-CM | POA: Insufficient documentation

## 2014-02-07 DIAGNOSIS — E1169 Type 2 diabetes mellitus with other specified complication: Secondary | ICD-10-CM | POA: Insufficient documentation

## 2014-02-07 HISTORY — DX: Essential (primary) hypertension: I10

## 2014-02-07 HISTORY — DX: Non-pressure chronic ulcer of other part of left foot with unspecified severity: L97.529

## 2014-02-07 LAB — CBG MONITORING, ED: GLUCOSE-CAPILLARY: 104 mg/dL — AB (ref 70–99)

## 2014-02-08 NOTE — ED Provider Notes (Signed)
CSN: [id number]     Arrival date & time 02/07/14  2254 History   First MD Initiated Contact with Patient 02/08/14 0004    This chart was scribed for Jeffery Boozeavid Ponciano Shealy, MD by Marica OtterNusrat Rahman, ED Scribe. This patient was seen in room APA04/APA04 and the patient's care was started at 12:06 AM.  Chief Complaint  Patient presents with  . Foot Ulcer  . Hand Pain     (Consider location/radiation/quality/duration/timing/severity/associated sxs/prior Treatment) HPI HPI Comments: Jeffery GroomsWalter R Robertson is a 38 y.o. male, with DM and HTN, who presents to the Emergency Department complaining of intermittent pain due to a left foot ulcer onset one month ago. Pt describes the pain as a dulling pain. Pt reports that he went to wound care clinic regularly until recently. Pt states the pain is aggravated by walking. Pt rates the pain a 6/7 out of 10 when he is walking. Pt denies fever or chills.   Pt is a current smoker who smokes 1.5 ppd.  Past Medical History  Diagnosis Date  . Diabetes mellitus without complication   . Hypertension   . Foot ulcer, left     secondary to DM   Past Surgical History  Procedure Laterality Date  . Appendectomy     No family history on file. History  Substance Use Topics  . Smoking status: Current Every Day Smoker  . Smokeless tobacco: Not on file  . Alcohol Use: Yes    Review of Systems    Allergies  Review of patient's allergies indicates no known allergies.  Home Medications   Current Outpatient Rx  Name  Route  Sig  Dispense  Refill  . lisinopril (PRINIVIL,ZESTRIL) 10 MG tablet   Oral   Take 10 mg by mouth daily.         Marland Kitchen. ibuprofen (ADVIL,MOTRIN) 200 MG tablet   Oral   Take 400 mg by mouth every 6 (six) hours as needed for pain.          . metFORMIN (GLUCOPHAGE) 500 MG tablet   Oral   Take 1 tablet (500 mg total) by mouth 2 (two) times daily with a meal.   60 tablet   1    BP 120/80  Pulse 94  Temp(Src) 97.8 F (36.6 C) (Oral)  Resp 16  Ht 5'  11" (1.803 m)  Wt 179 lb (81.194 kg)  BMI 24.98 kg/m2  SpO2 99% Physical Exam  Musculoskeletal:  ingrated area over the left third metarcal head 2 cm in diameter raised callous 1.2cm in diameter, no erythema or warmth    ED Course  Procedures (including critical care time) DIAGNOSTIC STUDIES: Oxygen Saturation is 99% on RA, normal by my interpretation.    COORDINATION OF CARE:  12:10 AM-Discussed treatment plan which includes (CXR, CBC panel, CMP, UA) with pt at bedside and pt agreed to plan.   Labs Review Labs Reviewed  CBG MONITORING, ED - Abnormal; Notable for the following:    Glucose-Capillary 104 (*)    All other components within normal limits   MDM   Final diagnoses:  Pain in left foot    Painful lesion of the left foot which apparently had started out as an ulcer but now is hyperkeratotic and protuberant. Old records are reviewed confirming treatment for the ulcer in that location with recurrent hyperkeratotic lesions. This will likely need to be excised the this would need to be done by a podiatrist. He is referred back to podiatry. He is advised to use  mole skin to decrease pressure on the lesion.  I personally performed the services described in this documentation, which was scribed in my presence. The recorded information has been reviewed and is accurate.    Jeffery Booze, MD 02/08/14 [phone]

## 2014-02-08 NOTE — Discharge Instructions (Signed)
Use warm soaks.  Apply mole skin to keep pressure off the sore area.  Make an appointment with the foot doctor.

## 2014-02-08 NOTE — ED Notes (Signed)
Pt alert & oriented x4, stable gait. Patient given discharge instructions, paperwork & prescription(s). Patient  instructed to stop at the registration desk to finish any additional paperwork. Patient verbalized understanding. Pt left department w/ no further questions. 

## 2014-03-15 ENCOUNTER — Emergency Department (HOSPITAL_COMMUNITY)
Admission: EM | Admit: 2014-03-15 | Discharge: 2014-03-15 | Disposition: A | Discharge status: Home / Self Care | Payer: Self-pay | Attending: Emergency Medicine | Admitting: Emergency Medicine

## 2014-03-15 ENCOUNTER — Encounter (HOSPITAL_COMMUNITY): Payer: Self-pay | Admitting: Emergency Medicine

## 2014-03-15 ENCOUNTER — Emergency Department (HOSPITAL_COMMUNITY): Payer: Self-pay

## 2014-03-15 DIAGNOSIS — F17200 Nicotine dependence, unspecified, uncomplicated: Secondary | ICD-10-CM | POA: Insufficient documentation

## 2014-03-15 DIAGNOSIS — T733XXA Exhaustion due to excessive exertion, initial encounter: Secondary | ICD-10-CM | POA: Insufficient documentation

## 2014-03-15 DIAGNOSIS — Y929 Unspecified place or not applicable: Secondary | ICD-10-CM | POA: Insufficient documentation

## 2014-03-15 DIAGNOSIS — IMO0002 Reserved for concepts with insufficient information to code with codable children: Secondary | ICD-10-CM | POA: Insufficient documentation

## 2014-03-15 DIAGNOSIS — Z79899 Other long term (current) drug therapy: Secondary | ICD-10-CM | POA: Insufficient documentation

## 2014-03-15 DIAGNOSIS — S46911A Strain of unspecified muscle, fascia and tendon at shoulder and upper arm level, right arm, initial encounter: Secondary | ICD-10-CM

## 2014-03-15 DIAGNOSIS — Z872 Personal history of diseases of the skin and subcutaneous tissue: Secondary | ICD-10-CM | POA: Insufficient documentation

## 2014-03-15 DIAGNOSIS — E119 Type 2 diabetes mellitus without complications: Secondary | ICD-10-CM | POA: Insufficient documentation

## 2014-03-15 DIAGNOSIS — I10 Essential (primary) hypertension: Secondary | ICD-10-CM | POA: Insufficient documentation

## 2014-03-15 DIAGNOSIS — Y9389 Activity, other specified: Secondary | ICD-10-CM | POA: Insufficient documentation

## 2014-03-15 NOTE — Discharge Instructions (Signed)
Your x-ray is negative for any fracture or dislocation. You do have a small spur on your ulnar bone. Use Tylenol or ibuprofen for soreness. Please see Dr. Romeo AppleHarrison for additional evaluation if discomfort continues.

## 2014-03-15 NOTE — ED Provider Notes (Signed)
CSN: [id number]     Arrival date & time 03/15/14  1243 History   First MD Initiated Contact with Patient 03/15/14 1305     Chief Complaint  Patient presents with  . Arm Pain     (Consider location/radiation/quality/duration/timing/severity/associated sxs/prior Treatment) HPI Comments: Patient is a 38 year old male who presents to the emergency department with right arm pain. The patient states that on yesterday April 30, he was punching a punching bag. He felt a pop in the posterior portion of his lower arm near the elbow. He states he had a very sharp pain at that time. He later noticed that there was a small knot on the posterior portion of his forearm. He states that today he does not have much pain in the area, but he was concerned about the small raised area following the events of yesterday. The patient has not had any previous operations or procedures involving the right arm.  Patient is a 38 y.o. male presenting with arm pain. The history is provided by the patient.  Arm Pain Pertinent negatives include no abdominal pain, arthralgias, chest pain, coughing or neck pain.    Past Medical History  Diagnosis Date  . Diabetes mellitus without complication   . Hypertension   . Foot ulcer, left     secondary to DM   Past Surgical History  Procedure Laterality Date  . Appendectomy     History reviewed. No pertinent family history. History  Substance Use Topics  . Smoking status: Current Every Day Smoker  . Smokeless tobacco: Not on file  . Alcohol Use: Yes     Comment: 4times a week/beer    Review of Systems  Constitutional: Negative for activity change.       All ROS Neg except as noted in HPI  HENT: Negative for nosebleeds.   Eyes: Negative for photophobia and discharge.  Respiratory: Negative for cough, shortness of breath and wheezing.   Cardiovascular: Negative for chest pain and palpitations.  Gastrointestinal: Negative for abdominal pain and blood in stool.   Genitourinary: Negative for dysuria, frequency and hematuria.  Musculoskeletal: Negative for arthralgias, back pain and neck pain.  Skin: Negative.   Neurological: Negative for dizziness, seizures and speech difficulty.  Psychiatric/Behavioral: Negative for hallucinations and confusion.      Allergies  Review of patient's allergies indicates no known allergies.  Home Medications   Prior to Admission medications   Medication Sig Start Date End Date Taking? Authorizing Provider  ibuprofen (ADVIL,MOTRIN) 200 MG tablet Take 400 mg by mouth every 6 (six) hours as needed for pain.    Yes Historical Provider, MD  lisinopril (PRINIVIL,ZESTRIL) 10 MG tablet Take 10 mg by mouth daily.   Yes Historical Provider, MD  metFORMIN (GLUCOPHAGE) 500 MG tablet Take 1 tablet (500 mg total) by mouth 2 (two) times daily with a meal. 08/20/13  Yes Lonia SkinnerLeslie K Sofia, PA-C   BP 120/90  Pulse 98  Temp(Src) 98.3 F (36.8 C) (Oral)  Resp 18  SpO2 100% Physical Exam  Nursing note and vitals reviewed. Constitutional: He is oriented to person, place, and time. He appears well-developed and well-nourished.  Non-toxic appearance.  HENT:  Head: Normocephalic.  Right Ear: Tympanic membrane and external ear normal.  Left Ear: Tympanic membrane and external ear normal.  Eyes: EOM and lids are normal. Pupils are equal, round, and reactive to light.  Neck: Normal range of motion. Neck supple. Carotid bruit is not present.  Cardiovascular: Normal rate, regular rhythm, normal heart sounds,  intact distal pulses and normal pulses.   Pulmonary/Chest: Breath sounds normal. No respiratory distress.  Abdominal: Soft. Bowel sounds are normal. There is no tenderness. There is no guarding.  Musculoskeletal: Normal range of motion.  There is full range of motion of the right shoulder and elbow. There is a small raised area just below the elbow on the ulnar surface of the forearm. This area is not hot, is not tender, there is no  red streaks related to it. There is no significant swelling. There is full range of motion of the right wrist and fingers. The capillary refill is less than 2 seconds. The radial pulse is 2+. There's no deformity of the bicep or tricep area. No palpable hematoma noted.  Lymphadenopathy:       Head (right side): No submandibular adenopathy present.       Head (left side): No submandibular adenopathy present.    He has no cervical adenopathy.  Neurological: He is alert and oriented to person, place, and time. He has normal strength. No cranial nerve deficit or sensory deficit.  Skin: Skin is warm and dry.  Psychiatric: He has a normal mood and affect. His speech is normal.    ED Course  Procedures (including critical care time) Labs Review Labs Reviewed - No data to display  Imaging Review No results found.   EKG Interpretation None      MDM X-ray of the right elbow shows a spur on the proximal ulnar. The x-ray is otherwise negative. The patient has been given the information from the examination as well as the x-ray. Suspect that the patient has a strain of the elbow area following a vigorous workout and punching the punching bag. Patient advised to use Tylenol or ibuprofen for soreness. He will see orthopedics if not improving.    Final diagnoses:  None    **I have reviewed nursing notes, vital signs, and all appropriate lab and imaging results for this patient.Kathie Dike*    Jarquis Walker M Tayt Moyers, PA-C 03/15/14 [phone]

## 2014-03-15 NOTE — ED Notes (Signed)
Rt arm pain, punched a punching bag yesterday with his fist, felt a pop and now has tender area app 3" below elbow .  No pain when just sitting. And not using his arm.  Good radial pulse and distal sensation

## 2014-03-15 NOTE — ED Notes (Signed)
Pt was hitting punching bag and heard at pop to right posterior lower arm. C/o swelling and pain now. Nad. Slight swelling noted. Radial pulses present

## 2014-03-18 NOTE — ED Provider Notes (Signed)
Medical screening examination/treatment/procedure(s) were performed by non-physician practitioner and as supervising physician I was immediately available for consultation/collaboration.   EKG Interpretation None        Eastyn Skalla L Wallace Cogliano, MD 03/18/14 1513 

## 2014-04-11 ENCOUNTER — Emergency Department (HOSPITAL_COMMUNITY)
Admission: EM | Admit: 2014-04-11 | Discharge: 2014-04-11 | Disposition: A | Discharge status: Home / Self Care | Payer: Self-pay | Attending: Emergency Medicine | Admitting: Emergency Medicine

## 2014-04-11 ENCOUNTER — Emergency Department (HOSPITAL_COMMUNITY): Payer: Self-pay

## 2014-04-11 ENCOUNTER — Encounter (HOSPITAL_COMMUNITY): Payer: Self-pay | Admitting: Emergency Medicine

## 2014-04-11 DIAGNOSIS — Y9389 Activity, other specified: Secondary | ICD-10-CM | POA: Insufficient documentation

## 2014-04-11 DIAGNOSIS — E119 Type 2 diabetes mellitus without complications: Secondary | ICD-10-CM | POA: Insufficient documentation

## 2014-04-11 DIAGNOSIS — R509 Fever, unspecified: Secondary | ICD-10-CM

## 2014-04-11 DIAGNOSIS — Z760 Encounter for issue of repeat prescription: Secondary | ICD-10-CM

## 2014-04-11 DIAGNOSIS — S90569A Insect bite (nonvenomous), unspecified ankle, initial encounter: Secondary | ICD-10-CM | POA: Insufficient documentation

## 2014-04-11 DIAGNOSIS — Z872 Personal history of diseases of the skin and subcutaneous tissue: Secondary | ICD-10-CM | POA: Insufficient documentation

## 2014-04-11 DIAGNOSIS — R5381 Other malaise: Secondary | ICD-10-CM | POA: Insufficient documentation

## 2014-04-11 DIAGNOSIS — Z79899 Other long term (current) drug therapy: Secondary | ICD-10-CM | POA: Insufficient documentation

## 2014-04-11 DIAGNOSIS — I10 Essential (primary) hypertension: Secondary | ICD-10-CM | POA: Insufficient documentation

## 2014-04-11 DIAGNOSIS — R000 Tachycardia, unspecified: Secondary | ICD-10-CM | POA: Insufficient documentation

## 2014-04-11 DIAGNOSIS — W57XXXA Bitten or stung by nonvenomous insect and other nonvenomous arthropods, initial encounter: Secondary | ICD-10-CM

## 2014-04-11 DIAGNOSIS — F17200 Nicotine dependence, unspecified, uncomplicated: Secondary | ICD-10-CM | POA: Insufficient documentation

## 2014-04-11 DIAGNOSIS — R5383 Other fatigue: Secondary | ICD-10-CM

## 2014-04-11 DIAGNOSIS — R51 Headache: Secondary | ICD-10-CM | POA: Insufficient documentation

## 2014-04-11 DIAGNOSIS — Y929 Unspecified place or not applicable: Secondary | ICD-10-CM | POA: Insufficient documentation

## 2014-04-11 LAB — BASIC METABOLIC PANEL
BUN: 12 mg/dL (ref 6–23)
CALCIUM: 9.1 mg/dL (ref 8.4–10.5)
CO2: 25 mEq/L (ref 19–32)
Chloride: 97 mEq/L (ref 96–112)
Creatinine, Ser: 1.12 mg/dL (ref 0.50–1.35)
GFR, EST NON AFRICAN AMERICAN: 84 mL/min — AB (ref >90)
Glucose, Bld: 111 mg/dL — ABNORMAL HIGH (ref 70–99)
POTASSIUM: 4.3 meq/L (ref 3.7–5.3)
SODIUM: 135 meq/L — AB (ref 137–147)

## 2014-04-11 LAB — CBC WITH DIFFERENTIAL/PLATELET
BASOS PCT: 1 % (ref 0–1)
Basophils Absolute: 0 10*3/uL (ref 0.0–0.1)
Eosinophils Absolute: 0 10*3/uL (ref 0.0–0.7)
Eosinophils Relative: 0 % (ref 0–5)
HCT: 46.9 % (ref 39.0–52.0)
HEMOGLOBIN: 16.9 g/dL (ref 13.0–17.0)
LYMPHS ABS: 0.7 10*3/uL (ref 0.7–4.0)
Lymphocytes Relative: 10 % — ABNORMAL LOW (ref 12–46)
MCH: 33.5 pg (ref 26.0–34.0)
MCHC: 36 g/dL (ref 30.0–36.0)
MCV: 92.9 fL (ref 78.0–100.0)
MONOS PCT: 7 % (ref 3–12)
Monocytes Absolute: 0.5 10*3/uL (ref 0.1–1.0)
NEUTROS ABS: 5.8 10*3/uL (ref 1.7–7.7)
NEUTROS PCT: 82 % — AB (ref 43–77)
PLATELETS: 102 10*3/uL — AB (ref 150–400)
RBC: 5.05 MIL/uL (ref 4.22–5.81)
RDW: 13.1 % (ref 11.5–15.5)
SMEAR REVIEW: DECREASED
WBC: 7.1 10*3/uL (ref 4.0–10.5)

## 2014-04-11 MED ORDER — METFORMIN HCL 500 MG PO TABS: 500.0000 mg | ORAL_TABLET | Freq: Two times a day (BID) | ORAL | Status: DC

## 2014-04-11 MED ORDER — HYDROCODONE-ACETAMINOPHEN 5-325 MG PO TABS: 1.0000 | ORAL_TABLET | ORAL | Status: DC | PRN

## 2014-04-11 MED ORDER — LISINOPRIL 10 MG PO TABS: 10.0000 mg | ORAL_TABLET | Freq: Every day | ORAL | Status: DC

## 2014-04-11 MED ORDER — DOXYCYCLINE HYCLATE 100 MG PO CAPS: 100.0000 mg | ORAL_CAPSULE | Freq: Two times a day (BID) | ORAL | Status: DC

## 2014-04-11 NOTE — Discharge Instructions (Signed)
Fever, Adult A fever is a higher than normal body temperature. In an adult, an oral temperature around 98.6 F (37 C) is considered normal. A temperature of 100.4 F (38 C) or higher is generally considered a fever. Mild or moderate fevers generally have no long-term effects and often do not require treatment. Extreme fever (greater than or equal to 106 F or 41.1 C) can cause seizures. The sweating that may occur with repeated or prolonged fever may cause dehydration. Elderly people can develop confusion during a fever. A measured temperature can vary with:  Age.  Time of day.  Method of measurement (mouth, underarm, rectal, or ear). The fever is confirmed by taking a temperature with a thermometer. Temperatures can be taken different ways. Some methods are accurate and some are not.  An oral temperature is used most commonly. Electronic thermometers are fast and accurate.  An ear temperature will only be accurate if the thermometer is positioned as recommended by the manufacturer.  A rectal temperature is accurate and done for those adults who have a condition where an oral temperature cannot be taken.  An underarm (axillary) temperature is not accurate and not recommended. Fever is a symptom, not a disease.  CAUSES   Infections commonly cause fever.  Some noninfectious causes for fever include:  Some arthritis conditions.  Some thyroid or adrenal gland conditions.  Some immune system conditions.  Some types of cancer.  A medicine reaction.  High doses of certain street drugs such as methamphetamine.  Dehydration.  Exposure to high outside or room temperatures.  Occasionally, the source of a fever cannot be determined. This is sometimes called a "fever of unknown origin" (FUO).  Some situations may lead to a temporary rise in body temperature that may go away on its own. Examples are:  Childbirth.  Surgery.  Intense exercise. HOME CARE INSTRUCTIONS   Take  appropriate medicines for fever. Follow dosing instructions carefully. If you use acetaminophen to reduce the fever, be careful to avoid taking other medicines that also contain acetaminophen. Do not take aspirin for a fever if you are younger than age 59. There is an association with Reye's syndrome. Reye's syndrome is a rare but potentially deadly disease.  If an infection is present and antibiotics have been prescribed, take them as directed. Finish them even if you start to feel better.  Rest as needed.  Maintain an adequate fluid intake. To prevent dehydration during an illness with prolonged or recurrent fever, you may need to drink extra fluid.Drink enough fluids to keep your urine clear or pale yellow.  Sponging or bathing with room temperature water may help reduce body temperature. Do not use ice water or alcohol sponge baths.  Dress comfortably, but do not over-bundle. SEEK MEDICAL CARE IF:   You are unable to keep fluids down.  You develop vomiting or diarrhea.  You are not feeling at least partly better after 3 days.  You develop new symptoms or problems. SEEK IMMEDIATE MEDICAL CARE IF:   You have shortness of breath or trouble breathing.  You develop excessive weakness.  You are dizzy or you faint.  You are extremely thirsty or you are making little or no urine.  You develop new pain that was not there before (such as in the head, neck, chest, back, or abdomen).  You have persistant vomiting and diarrhea for more than 1 to 2 days.  You develop a stiff neck or your eyes become sensitive to light.  You develop a  skin rash.  You have a fever or persistent symptoms for more than 2 to 3 days.  You have a fever and your symptoms suddenly get worse. MAKE SURE YOU:   Understand these instructions.  Will watch your condition.  Will get help right away if you are not doing well or get worse. Document Released: 04/27/2001 Document Revised: 01/24/2012 Document  Reviewed: 09/02/2011 [facility] Patient Information 2014 [place].  Tick Bite Information Ticks are insects that attach themselves to the skin and draw blood for food. There are various types of ticks. Common types include wood ticks and deer ticks. Most ticks live in shrubs and grassy areas. Ticks can climb onto your body when you make contact with leaves or grass where the tick is waiting. The most common places on the body for ticks to attach themselves are the scalp, neck, armpits, waist, and groin. Most tick bites are harmless, but sometimes ticks carry germs that cause diseases. These germs can be spread to a person during the tick's feeding process. The chance of a disease spreading through a tick bite depends on:   The type of tick.  Time of year.   How long the tick is attached.   Geographic location.  HOW CAN YOU PREVENT TICK BITES? Take these steps to help prevent tick bites when you are outdoors:  Wear protective clothing. Long sleeves and long pants are best.   Wear white clothes so you can see ticks more easily.  Tuck your pant legs into your socks.   If walking on a trail, stay in the middle of the trail to avoid brushing against bushes.  Avoid walking through areas with long grass.  Put insect repellent on all exposed skin and along boot tops, pant legs, and sleeve cuffs.   Check clothing, hair, and skin repeatedly and before going inside.   Brush off any ticks that are not attached.  Take a shower or bath as soon as possible after being outdoors.  WHAT IS THE PROPER WAY TO REMOVE A TICK? Ticks should be removed as soon as possible to help prevent diseases caused by tick bites. 1. If latex gloves are available, put them on before trying to remove a tick.  2. Using fine-point tweezers, grasp the tick as close to the skin as possible. You may also use curved forceps or a tick removal tool. Grasp the tick as close to its head as possible. Avoid  grasping the tick on its body. 3. Pull gently with steady upward pressure until the tick lets go. Do not twist the tick or jerk it suddenly. This may break off the tick's head or mouth parts. 4. Do not squeeze or crush the tick's body. This could force disease-carrying fluids from the tick into your body.  5. After the tick is removed, wash the bite area and your hands with soap and water or other disinfectant such as alcohol. 6. Apply a small amount of antiseptic cream or ointment to the bite site.  7. Wash and disinfect any instruments that were used.  Do not try to remove a tick by applying a hot match, petroleum jelly, or fingernail polish to the tick. These methods do not work and may increase the chances of disease being spread from the tick bite.  WHEN SHOULD YOU SEEK MEDICAL CARE? Contact your health care provider if you are unable to remove a tick from your skin or if a part of the tick breaks off and is stuck in the  skin.  After a tick bite, you need to be aware of signs and symptoms that could be related to diseases spread by ticks. Contact your health care provider if you develop any of the following in the days or weeks after the tick bite:  Unexplained fever.  Rash. A circular rash that appears days or weeks after the tick bite may indicate the possibility of Lyme disease. The rash may resemble a target with a bull's-eye and may occur at a different part of your body than the tick bite.  Redness and swelling in the area of the tick bite.   Tender, swollen lymph glands.   Diarrhea.   Weight loss.   Cough.   Fatigue.   Muscle, joint, or bone pain.   Abdominal pain.   Headache.   Lethargy or a change in your level of consciousness.  Difficulty walking or moving your legs.   Numbness in the legs.   Paralysis.  Shortness of breath.   Confusion.   Repeated vomiting.  Document Released: 10/29/2000 Document Revised: 08/22/2013 Document Reviewed:  04/11/2013 [facility] Patient Information 2014 [place], [place].   Take the entire course of doxycycline.  Take ibuprofen 600 mg up to 4 times daily if needed for aches and headache.  You may use the hydrocodone if needed for additional pain relief.  Use caution as this medication as it will make you drowsy.  Return here or see your Dr. for recheck if your symptoms persist or worsen in any way.  Your lab tests and chest x-ray normal today.  You are being treated for your recent tick exposure with this medication.

## 2014-04-11 NOTE — ED Notes (Signed)
Pt complain of fever and chills

## 2014-04-12 LAB — ROCKY MTN SPOTTED FVR AB, IGG-BLOOD: RMSF IGG: 0.22 IV

## 2014-04-12 NOTE — ED Provider Notes (Signed)
Medical screening examination/treatment/procedure(s) were performed by non-physician practitioner and as supervising physician I was immediately available for consultation/collaboration.   EKG Interpretation None        Kemberly Taves, MD 04/12/14 1542 

## 2014-04-12 NOTE — ED Provider Notes (Signed)
CSN: [id number]     Arrival date & time 04/11/14  [id number] History   First MD Initiated Contact with Patient 04/11/14 1117     Chief Complaint  Patient presents with  . Tick Removal     (Consider location/radiation/quality/duration/timing/severity/associated sxs/prior Treatment) HPI Comments: Jeffery Robertson is a 38 y.o. Male with complaints of fever, chills and arthralgias.  He describes discovering a tick embedded and fairly engorged on his right posterior upper thigh which he removed last week.  Over the past 2 days he has had complaint of fevers, chills, mild headache and aching in "all his joints".  He denies rash, nausea, vomiting, neck pain or stiffness, photophobia or other visual changes, sore throat, cough or shortness of breath, chest pain, abdominal pain, diarrhea and dysuria.  He has taken ibuprofen with transient relief.  Wife at bedside also states he has been out of his metformin and lisinopril for the past month and believes his symptoms may be related to this.  He has not checked his cbg recently.  He has been maintaining fluid intake, but has decreased appetite over the past 2 days.     The history is provided by the patient and the spouse.    Past Medical History  Diagnosis Date  . Diabetes mellitus without complication   . Hypertension   . Foot ulcer, left     secondary to DM   Past Surgical History  Procedure Laterality Date  . Appendectomy     No family history on file. History  Substance Use Topics  . Smoking status: Current Every Day Smoker  . Smokeless tobacco: Not on file  . Alcohol Use: Yes     Comment: 4times a week/beer    Review of Systems  Constitutional: Positive for fever, chills and fatigue.  HENT: Negative for congestion and sore throat.   Eyes: Negative.  Negative for photophobia and visual disturbance.  Respiratory: Negative for cough, chest tightness and shortness of breath.   Cardiovascular: Negative for chest pain.  Gastrointestinal:  Negative for nausea, vomiting and abdominal pain.  Genitourinary: Negative.   Musculoskeletal: Positive for arthralgias. Negative for joint swelling and neck pain.  Skin: Negative.  Negative for color change, rash and wound.  Neurological: Positive for headaches. Negative for dizziness, weakness, light-headedness and numbness.  Psychiatric/Behavioral: Negative.       Allergies  Review of patient's allergies indicates no known allergies.  Home Medications   Prior to Admission medications   Medication Sig Start Date End Date Taking? Authorizing Provider  Aromatic Inhalants (INHALER DECONGESTANT IN) Inhale 2 puffs into the lungs as needed (shortness of breath).   Yes Historical Provider, MD  ibuprofen (ADVIL,MOTRIN) 200 MG tablet Take 400 mg by mouth every 6 (six) hours as needed for pain.    Yes Historical Provider, MD  doxycycline (VIBRAMYCIN) 100 MG capsule Take 1 capsule (100 mg total) by mouth 2 (two) times daily. 04/11/14   Burgess AmorJulie Dera Vanaken, PA-C  HYDROcodone-acetaminophen (NORCO/VICODIN) 5-325 MG per tablet Take 1 tablet by mouth every 4 (four) hours as needed for moderate pain. 04/11/14   Burgess AmorJulie Ercia Crisafulli, PA-C  lisinopril (PRINIVIL,ZESTRIL) 10 MG tablet Take 1 tablet (10 mg total) by mouth daily. 04/11/14   Burgess AmorJulie Brevyn Ring, PA-C  metFORMIN (GLUCOPHAGE) 500 MG tablet Take 1 tablet (500 mg total) by mouth 2 (two) times daily with a meal. 04/11/14   Burgess AmorJulie Mabel Unrein, PA-C   BP 119/77  Pulse 107  Temp(Src) 98.6 F (37 C)  Resp 18  Wt 180  lb (81.647 kg)  SpO2 100% Physical Exam  Nursing note and vitals reviewed. Constitutional: He appears well-developed and well-nourished.  HENT:  Head: Normocephalic and atraumatic.  Eyes: Conjunctivae are normal.  Neck: Normal range of motion.  Cardiovascular: Normal rate, regular rhythm, normal heart sounds and intact distal pulses.   Borderline tachycardic.  Pulmonary/Chest: Effort normal and breath sounds normal. He has no wheezes.  Abdominal: Soft. Bowel sounds  are normal. He exhibits no distension. There is no tenderness.  Musculoskeletal: Normal range of motion.  Neurological: He is alert.  Skin: Skin is warm and dry. No rash noted. No erythema.  No visible lesion or bulls eye at site of tick removal. No evidence of retained fb.  Psychiatric: He has a normal mood and affect.    ED Course  Procedures (including critical care time) Labs Review Labs Reviewed  BASIC METABOLIC PANEL - Abnormal; Notable for the following:    Sodium 135 (*)    Glucose, Bld 111 (*)    GFR calc non Af Amer 84 (*)    All other components within normal limits  CBC WITH DIFFERENTIAL - Abnormal; Notable for the following:    Platelets 102 (*)    Neutrophils Relative % 82 (*)    Lymphocytes Relative 10 (*)    All other components within normal limits  ROCKY MTN SPOTTED FVR AB, IGG-BLOOD    Imaging Review Dg Chest 2 View  04/11/2014   CLINICAL DATA:  Chills, body aches and fatigue. History of recent tick removal.  EXAM: CHEST - 2 VIEW  COMPARISON:  12/24/2009  FINDINGS: The heart size and mediastinal contours are within normal limits. There is no evidence of pulmonary edema, consolidation, pneumothorax, nodule or pleural fluid. Stable scoliosis.  IMPRESSION: No active disease.   Electronically Signed   By: Irish Lack M.D.   On: 04/11/2014 11:28     EKG Interpretation None      MDM   Final diagnoses:  Tick bite  Fever  Medication refill    Pt with sx suspicious for possible tick borne infection.  Low Na+ and platelets fit RMSF possibility.  He was placed on doxycycline 100 mg bid x 14 days.  Stressed need to complete the full course. Advised he needs a recheck by his pcp (establishing care with health dept within 1 month) or return here for any worsened sx.  Tylenol or motrin for fever, body aches.  Tick titers ordered prior to dc home.  Pt also requested medication refills.  Discussed that his cbg and bp normal today.  Will refill, but advised close watch  on cbgs.      Burgess Amor, PA-C 04/12/14 1143

## 2014-05-23 ENCOUNTER — Emergency Department (HOSPITAL_COMMUNITY)
Admission: EM | Admit: 2014-05-23 | Discharge: 2014-05-23 | Disposition: A | Discharge status: Home / Self Care | Payer: Self-pay | Attending: Emergency Medicine | Admitting: Emergency Medicine

## 2014-05-23 ENCOUNTER — Encounter (HOSPITAL_COMMUNITY): Payer: Self-pay | Admitting: Emergency Medicine

## 2014-05-23 ENCOUNTER — Emergency Department (HOSPITAL_COMMUNITY): Payer: Self-pay

## 2014-05-23 DIAGNOSIS — IMO0002 Reserved for concepts with insufficient information to code with codable children: Secondary | ICD-10-CM | POA: Insufficient documentation

## 2014-05-23 DIAGNOSIS — F17200 Nicotine dependence, unspecified, uncomplicated: Secondary | ICD-10-CM | POA: Insufficient documentation

## 2014-05-23 DIAGNOSIS — S9031XA Contusion of right foot, initial encounter: Secondary | ICD-10-CM

## 2014-05-23 DIAGNOSIS — Y9389 Activity, other specified: Secondary | ICD-10-CM | POA: Insufficient documentation

## 2014-05-23 DIAGNOSIS — L97509 Non-pressure chronic ulcer of other part of unspecified foot with unspecified severity: Secondary | ICD-10-CM | POA: Insufficient documentation

## 2014-05-23 DIAGNOSIS — Z791 Long term (current) use of non-steroidal anti-inflammatories (NSAID): Secondary | ICD-10-CM | POA: Insufficient documentation

## 2014-05-23 DIAGNOSIS — Y929 Unspecified place or not applicable: Secondary | ICD-10-CM | POA: Insufficient documentation

## 2014-05-23 DIAGNOSIS — S9030XA Contusion of unspecified foot, initial encounter: Secondary | ICD-10-CM | POA: Insufficient documentation

## 2014-05-23 DIAGNOSIS — E1169 Type 2 diabetes mellitus with other specified complication: Secondary | ICD-10-CM | POA: Insufficient documentation

## 2014-05-23 DIAGNOSIS — Z79899 Other long term (current) drug therapy: Secondary | ICD-10-CM | POA: Insufficient documentation

## 2014-05-23 DIAGNOSIS — R000 Tachycardia, unspecified: Secondary | ICD-10-CM | POA: Insufficient documentation

## 2014-05-23 DIAGNOSIS — I10 Essential (primary) hypertension: Secondary | ICD-10-CM | POA: Insufficient documentation

## 2014-05-23 LAB — CBC WITH DIFFERENTIAL/PLATELET
BASOS ABS: 0 10*3/uL (ref 0.0–0.1)
BASOS PCT: 0 % (ref 0–1)
Eosinophils Absolute: 0 10*3/uL (ref 0.0–0.7)
Eosinophils Relative: 0 % (ref 0–5)
HEMATOCRIT: 43.3 % (ref 39.0–52.0)
HEMOGLOBIN: 15.3 g/dL (ref 13.0–17.0)
LYMPHS PCT: 12 % (ref 12–46)
Lymphs Abs: 1.6 10*3/uL (ref 0.7–4.0)
MCH: 33.1 pg (ref 26.0–34.0)
MCHC: 35.3 g/dL (ref 30.0–36.0)
MCV: 93.7 fL (ref 78.0–100.0)
MONO ABS: 1.3 10*3/uL — AB (ref 0.1–1.0)
Monocytes Relative: 10 % (ref 3–12)
NEUTROS ABS: 10.1 10*3/uL — AB (ref 1.7–7.7)
NEUTROS PCT: 78 % — AB (ref 43–77)
Platelets: 160 10*3/uL (ref 150–400)
RBC: 4.62 MIL/uL (ref 4.22–5.81)
RDW: 12.5 % (ref 11.5–15.5)
WBC: 13 10*3/uL — AB (ref 4.0–10.5)

## 2014-05-23 LAB — BASIC METABOLIC PANEL
ANION GAP: 15 (ref 5–15)
BUN: 9 mg/dL (ref 6–23)
CHLORIDE: 99 meq/L (ref 96–112)
CO2: 25 meq/L (ref 19–32)
CREATININE: 0.84 mg/dL (ref 0.50–1.35)
Calcium: 9.4 mg/dL (ref 8.4–10.5)
GFR calc non Af Amer: >90 mL/min (ref >90)
Glucose, Bld: 86 mg/dL (ref 70–99)
POTASSIUM: 4.2 meq/L (ref 3.7–5.3)
Sodium: 139 mEq/L (ref 137–147)

## 2014-05-23 MED ORDER — HYDROCODONE-ACETAMINOPHEN 5-325 MG PO TABS: 1.0000 | ORAL_TABLET | Freq: Four times a day (QID) | ORAL | Status: DC | PRN

## 2014-05-23 MED ORDER — SODIUM CHLORIDE 0.9 % IV BOLUS (SEPSIS)
1000.0000 mL | Freq: Once | INTRAVENOUS | Status: AC
  Administered 2014-05-23: 1000 mL via INTRAVENOUS

## 2014-05-23 MED ORDER — CEPHALEXIN 500 MG PO CAPS: 500.0000 mg | ORAL_CAPSULE | Freq: Four times a day (QID) | ORAL | Status: DC

## 2014-05-23 MED ORDER — HYDROCODONE-ACETAMINOPHEN 5-325 MG PO TABS
1.0000 | ORAL_TABLET | Freq: Once | ORAL | Status: AC
  Administered 2014-05-23: 1 via ORAL
  Filled 2014-05-23: qty 1

## 2014-05-23 NOTE — ED Notes (Addendum)
Pt states he is diabetic and he believes an ulcer may be beginning to left heel area. Pt also states he kicked a piece of wood last night and now has swelling to right foot. Temp of 99.5

## 2014-05-23 NOTE — ED Provider Notes (Signed)
CSN: [id number]     Arrival date & time 05/23/14  1250 History  This chart was scribed for Jeffery LennertJoseph L Veta Dambrosia, MD by Ardelia Memsylan Malpass, ED Scribe. This patient was seen in room APA19/[facility]19 and the patient's care was started at 1:45 PM.   Chief Complaint  Patient presents with  . Foot Pain    Patient is a 38 y.o. male presenting with lower extremity pain. The history is provided by the patient. No language interpreter was used.  Foot Pain This is a recurrent (hx diabetic foot ulcers) problem. The current episode started 2 days ago. The problem occurs rarely. The problem has been gradually worsening. Pertinent negatives include no chest pain, no abdominal pain and no headaches. The symptoms are aggravated by walking. Nothing relieves the symptoms. He has tried nothing for the symptoms.   HPI Comments: Larae GroomsWalter R Hulon is a 38 y.o. male with a history of HTN and T2DM who presents to the Emergency Department complaining of a gradually worsening area of pain to his left heel over the past few days. He believes that he is having a diabetic foot ulcer to the area, and he reports a history of prior diabetic foot ulcers. He has a secondary complaint of intermittent, moderate right foot pain onset after kicking an object last night.   Past Medical History  Diagnosis Date  . Diabetes mellitus without complication   . Hypertension   . Foot ulcer, left     secondary to DM   Past Surgical History  Procedure Laterality Date  . Appendectomy     No family history on file. History  Substance Use Topics  . Smoking status: Current Every Day Smoker  . Smokeless tobacco: Not on file  . Alcohol Use: Yes     Comment: 2times a week/beer    Review of Systems  Constitutional: Negative for appetite change and fatigue.  HENT: Negative for congestion, ear discharge and sinus pressure.   Eyes: Negative for discharge.  Respiratory: Negative for cough.   Cardiovascular: Negative for chest pain.  Gastrointestinal:  Negative for abdominal pain and diarrhea.  Genitourinary: Negative for frequency and hematuria.  Musculoskeletal: Negative for back pain.       Bilateral foot pain  Skin: Negative for rash.  Neurological: Negative for seizures and headaches.  Psychiatric/Behavioral: Negative for hallucinations.    Allergies  Review of patient's allergies indicates no known allergies.  Home Medications   Prior to Admission medications   Medication Sig Start Date End Date Taking? Authorizing Provider  Aromatic Inhalants (INHALER DECONGESTANT IN) Inhale 2 puffs into the lungs as needed (shortness of breath).   Yes Historical Provider, MD  ibuprofen (ADVIL,MOTRIN) 200 MG tablet Take 400 mg by mouth every 6 (six) hours as needed for pain.    Yes Historical Provider, MD  lisinopril (PRINIVIL,ZESTRIL) 10 MG tablet Take 1 tablet (10 mg total) by mouth daily. 04/11/14  Yes Burgess AmorJulie Idol, PA-C  metFORMIN (GLUCOPHAGE) 500 MG tablet Take 1 tablet (500 mg total) by mouth 2 (two) times daily with a meal. 04/11/14  Yes Burgess AmorJulie Idol, PA-C   Triage Vitals: BP 112/82  Pulse 132  Temp(Src) 99.5 F (37.5 C) (Oral)  Ht 5\' 10"  (1.778 m)  Wt 180 lb (81.647 kg)  BMI 25.83 kg/m2  SpO2 99%  Physical Exam  Nursing note and vitals reviewed. Constitutional: He is oriented to person, place, and time. He appears well-developed.  HENT:  Head: Normocephalic.  Eyes: Conjunctivae and EOM are normal. No scleral icterus.  Neck: Neck supple. No thyromegaly present.  Cardiovascular: Regular rhythm.  Exam reveals no gallop and no friction rub.   No murmur heard. Mildly tachycardic  Pulmonary/Chest: No stridor. He has no wheezes. He has no rales. He exhibits no tenderness.  Abdominal: He exhibits no distension. There is no tenderness. There is no rebound.  Musculoskeletal: Normal range of motion. He exhibits tenderness. He exhibits no edema.  Tenderness throughout right foot  Lymphadenopathy:    He has no cervical adenopathy.   Neurological: He is oriented to person, place, and time. He exhibits normal muscle tone. Coordination normal.  Skin: No rash noted. No erythema.  0.5 cm ulcer to left heel  Psychiatric: He has a normal mood and affect. His behavior is normal.    ED Course  Procedures (including critical care time)  DIAGNOSTIC STUDIES: Oxygen Saturation is 99% on RA, normal by my interpretation.    COORDINATION OF CARE: 1:49 PM- Discussed plan to obtain a CBC and BMP, along with an X-ray of pt's right foot. Will also order IV fluids. Pt advised of plan for treatment and pt agrees.  Labs Review Labs Reviewed  CBC WITH DIFFERENTIAL  BASIC METABOLIC PANEL    Imaging Review No results found.   EKG Interpretation None      MDM   Final diagnoses:  None    The chart was scribed for me under my direct supervision.  I personally performed the history, physical, and medical decision making and all procedures in the evaluation of this patient.Jeffery Lennert, MD 05/24/14 2259

## 2014-05-23 NOTE — Discharge Instructions (Signed)
Follow-up in 1-2 weeks.

## 2014-05-26 ENCOUNTER — Emergency Department (HOSPITAL_COMMUNITY)
Admission: EM | Admit: 2014-05-26 | Discharge: 2014-05-26 | Disposition: A | Discharge status: Home / Self Care | Payer: Self-pay | Attending: Emergency Medicine | Admitting: Emergency Medicine

## 2014-05-26 ENCOUNTER — Encounter (HOSPITAL_COMMUNITY): Payer: Self-pay | Admitting: Emergency Medicine

## 2014-05-26 DIAGNOSIS — Z79899 Other long term (current) drug therapy: Secondary | ICD-10-CM | POA: Insufficient documentation

## 2014-05-26 DIAGNOSIS — X58XXXA Exposure to other specified factors, initial encounter: Secondary | ICD-10-CM | POA: Insufficient documentation

## 2014-05-26 DIAGNOSIS — L988 Other specified disorders of the skin and subcutaneous tissue: Secondary | ICD-10-CM | POA: Insufficient documentation

## 2014-05-26 DIAGNOSIS — Z792 Long term (current) use of antibiotics: Secondary | ICD-10-CM | POA: Insufficient documentation

## 2014-05-26 DIAGNOSIS — F17200 Nicotine dependence, unspecified, uncomplicated: Secondary | ICD-10-CM | POA: Insufficient documentation

## 2014-05-26 DIAGNOSIS — I10 Essential (primary) hypertension: Secondary | ICD-10-CM | POA: Insufficient documentation

## 2014-05-26 DIAGNOSIS — Z872 Personal history of diseases of the skin and subcutaneous tissue: Secondary | ICD-10-CM | POA: Insufficient documentation

## 2014-05-26 DIAGNOSIS — E119 Type 2 diabetes mellitus without complications: Secondary | ICD-10-CM | POA: Insufficient documentation

## 2014-05-26 DIAGNOSIS — Y939 Activity, unspecified: Secondary | ICD-10-CM | POA: Insufficient documentation

## 2014-05-26 DIAGNOSIS — Y929 Unspecified place or not applicable: Secondary | ICD-10-CM | POA: Insufficient documentation

## 2014-05-26 DIAGNOSIS — S90822A Blister (nonthermal), left foot, initial encounter: Secondary | ICD-10-CM

## 2014-05-26 LAB — CBG MONITORING, ED: Glucose-Capillary: 122 mg/dL — ABNORMAL HIGH (ref 70–99)

## 2014-05-26 NOTE — ED Notes (Signed)
Dressing applied as ordered to left foot

## 2014-05-26 NOTE — ED Notes (Signed)
Large blister left foot, bottom of foot. Opened up and has now become a larger open sore. Pt is type 2 diabetic and has problems with sores healing.

## 2014-05-26 NOTE — Discharge Instructions (Signed)
    Blisters Blisters are fluid-filled sacs that form within the skin. Common causes of blistering are friction, burns, and exposure to irritating chemicals. The fluid in the blister protects the underlying damaged skin. Most of the time it is not recommended that you open blisters. When a blister is opened, there is an increased chance for infection. Usually, a blister will open on its own. They then dry up and peel off within 10 days. If the blister is tense and uncomfortable (painful) the fluid may be drained. If it is drained the roof of the blister should be left intact. The draining should only be done by a medical professional under aseptic conditions. Poorly fitting shoes and boots can cause blisters by being too tight or too loose. Wearing extra socks or using tape, bandages, or pads over the blister-prone area helps prevent the problem by reducing friction. Blisters heal more slowly if you have diabetes or if you have problems with your circulation. You need to be careful about medical follow-up to prevent infection. HOME CARE INSTRUCTIONS  Protect areas where blisters have formed until the skin is healed. Use a special bandage with a hole cut in the middle around the blister. This reduces pressure and friction. When the blister breaks, trim off the loose skin and keep the area clean by washing it with soap daily. Soaking the blister or broken-open blister with diluted vinegar twice daily for 15 minutes will dry it up and speed the healing. Use 3 tablespoons of white vinegar per quart of water (45 mL white vinegar per liter of water). An antibiotic ointment and a bandage can be used to cover the area after soaking.  SEEK MEDICAL CARE IF:   You develop increased redness, pain, swelling, or drainage in the blistered area.  You develop a pus-like discharge from the blistered area, chills, or a fever. MAKE SURE YOU:   Understand these instructions.  Will watch your condition.  Will get help  right away if you are not doing well or get worse. Document Released: 12/09/2004 Document Revised: 01/24/2012 Document Reviewed: 11/06/2008 Exit[employer] Patient Information 2015 [employer]. This information is not intended to replace advice given to you by your health care provider. Make sure you discuss any questions you have with your health care provider.  

## 2014-05-29 NOTE — ED Provider Notes (Signed)
CSN: [id number]     Arrival date & time 05/26/14  2144 History   First MD Initiated Contact with Patient 05/26/14 2232     Chief Complaint  Patient presents with  . Extremity Laceration     (Consider location/radiation/quality/duration/timing/severity/associated sxs/prior Treatment) HPI Comments: Jeffery Robertson is a 38 y.o. male who presents to the Emergency Department complaining of large, open blister to the plantar surface of the left foot.  He states that he noticed it several hours prior to ED arrival after wearing a pair of tight fitting shoes.  He was seen here several days ago for a similar blister to the heel which he states is now beginning to heal.  He is currently taking antibiotics and reports being a diabetic.  He denies fever, redness, chills or numbness or his feet.  He states that he cleaned the blister with soap and water and placed the skin over the open area.    The history is provided by the patient.    Past Medical History  Diagnosis Date  . Diabetes mellitus without complication   . Hypertension   . Foot ulcer, left     secondary to DM   Past Surgical History  Procedure Laterality Date  . Appendectomy     History reviewed. No pertinent family history. History  Substance Use Topics  . Smoking status: Current Every Day Smoker  . Smokeless tobacco: Not on file  . Alcohol Use: Yes     Comment: 2times a week/beer    Review of Systems  Constitutional: Negative for fever and chills.  Genitourinary: Negative for dysuria and difficulty urinating.  Musculoskeletal: Negative for arthralgias and joint swelling.  Skin: Positive for wound. Negative for color change.       Open blister left foot  Neurological: Negative for dizziness, weakness and numbness.  All other systems reviewed and are negative.     Allergies  Review of patient's allergies indicates no known allergies.  Home Medications   Prior to Admission medications   Medication Sig Start Date  End Date Taking? Authorizing Provider  cephALEXin (KEFLEX) 500 MG capsule Take 1 capsule (500 mg total) by mouth 4 (four) times daily. 05/23/14  Yes Benny Lennert, MD  HYDROcodone-acetaminophen (NORCO/VICODIN) 5-325 MG per tablet Take 1 tablet by mouth every 6 (six) hours as needed. pain   Yes Historical Provider, MD  ibuprofen (ADVIL,MOTRIN) 200 MG tablet Take 400 mg by mouth every 6 (six) hours as needed for pain.    Yes Historical Provider, MD  lisinopril (PRINIVIL,ZESTRIL) 10 MG tablet Take 1 tablet (10 mg total) by mouth daily. 04/11/14  Yes Burgess Amor, PA-C  metFORMIN (GLUCOPHAGE) 500 MG tablet Take 1 tablet (500 mg total) by mouth 2 (two) times daily with a meal. 04/11/14  Yes Burgess Amor, PA-C  vitamin B-12 (CYANOCOBALAMIN) 1000 MCG tablet Take 1,000 mcg by mouth daily.   Yes Historical Provider, MD   BP 122/75  Pulse 109  Temp(Src) 97.7 F (36.5 C) (Oral)  Resp 18  Ht 5\' 6"  (1.676 m)  Wt 172 lb (78.019 kg)  BMI 27.77 kg/m2  SpO2 99% Physical Exam  Nursing note and vitals reviewed. Constitutional: He is oriented to person, place, and time. He appears well-developed and well-nourished. No distress.  HENT:  Head: Normocephalic and atraumatic.  Cardiovascular: Normal rate, regular rhythm, normal heart sounds and intact distal pulses.   Pulmonary/Chest: Effort normal and breath sounds normal. No respiratory distress.  Musculoskeletal: He exhibits tenderness. He exhibits no  edema.  Large blister to the plantar surface of the left foot at the base of the first MT.   ROM is preserved.  DP pulse is brisk,distal sensation intact.  No erythema, abrasion, bruising or bony deformity. Skin is well approximated.  No red streaks.   No proximal tenderness. No edema  Neurological: He is alert and oriented to person, place, and time. He exhibits normal muscle tone. Coordination normal.  Skin: Skin is warm and dry.    ED Course  Procedures (including critical care time) Labs Review Labs Reviewed   CBG MONITORING, ED - Abnormal; Notable for the following:    Glucose-Capillary 122 (*)    All other components within normal limits    Imaging Review No results found.   EKG Interpretation None      MDM   Final diagnoses:  Blister of foot without infection, left, initial encounter    Blister to plantar surface of the left foot w/o clinical signs of infection.  Pt currently taking antibiotics.  Advised to keep his feet clean adn dry as possible.  Given referral for podiatry.  Pt agrees to plan and appears stable for d/c    Jeffery Robertson L. Trisha Mangleriplett, PA-C 05/29/14 1806

## 2014-05-30 NOTE — ED Provider Notes (Signed)
Medical screening examination/treatment/procedure(s) were performed by non-physician practitioner and as supervising physician I was immediately available for consultation/collaboration.   EKG Interpretation None        Author [place] L Vanna Shavers, MD 05/30/14 1617 

## 2014-07-09 ENCOUNTER — Encounter (HOSPITAL_COMMUNITY): Payer: Self-pay | Admitting: Emergency Medicine

## 2014-07-09 ENCOUNTER — Emergency Department (HOSPITAL_COMMUNITY)
Admission: EM | Admit: 2014-07-09 | Discharge: 2014-07-09 | Disposition: A | Discharge status: Home / Self Care | Payer: Self-pay | Attending: Emergency Medicine | Admitting: Emergency Medicine

## 2014-07-09 DIAGNOSIS — R109 Unspecified abdominal pain: Secondary | ICD-10-CM | POA: Insufficient documentation

## 2014-07-09 DIAGNOSIS — Z872 Personal history of diseases of the skin and subcutaneous tissue: Secondary | ICD-10-CM | POA: Insufficient documentation

## 2014-07-09 DIAGNOSIS — I10 Essential (primary) hypertension: Secondary | ICD-10-CM | POA: Insufficient documentation

## 2014-07-09 DIAGNOSIS — Z79899 Other long term (current) drug therapy: Secondary | ICD-10-CM | POA: Insufficient documentation

## 2014-07-09 DIAGNOSIS — M7712 Lateral epicondylitis, left elbow: Secondary | ICD-10-CM

## 2014-07-09 DIAGNOSIS — F17200 Nicotine dependence, unspecified, uncomplicated: Secondary | ICD-10-CM | POA: Insufficient documentation

## 2014-07-09 DIAGNOSIS — M7710 Lateral epicondylitis, unspecified elbow: Secondary | ICD-10-CM | POA: Insufficient documentation

## 2014-07-09 DIAGNOSIS — Z9089 Acquired absence of other organs: Secondary | ICD-10-CM | POA: Insufficient documentation

## 2014-07-09 DIAGNOSIS — K5900 Constipation, unspecified: Secondary | ICD-10-CM | POA: Insufficient documentation

## 2014-07-09 DIAGNOSIS — E119 Type 2 diabetes mellitus without complications: Secondary | ICD-10-CM | POA: Insufficient documentation

## 2014-07-09 HISTORY — DX: Irritable bowel syndrome, unspecified: K58.9

## 2014-07-09 NOTE — ED Notes (Signed)
Pt states he has been constipated with abdominal pain for 15 days. No BM at all. Pt has IBS and usually has several BM's per day. Lower lt abdominal pain.

## 2014-07-09 NOTE — Discharge Instructions (Signed)
Constipation °Constipation is when a person has fewer than three bowel movements a week, has difficulty having a bowel movement, or has stools that are dry, hard, or larger than normal. As people grow older, constipation is more common. If you try to fix constipation with medicines that make you have a bowel movement (laxatives), the problem may get worse. Long-term laxative use may cause the muscles of the colon to become weak. A low-fiber diet, not taking in enough fluids, and taking certain medicines may make constipation worse.  °CAUSES  °· Certain medicines, such as antidepressants, pain medicine, iron supplements, antacids, and water pills.   °· Certain diseases, such as diabetes, irritable bowel syndrome (IBS), thyroid disease, or depression.   °· Not drinking enough water.   °· Not eating enough fiber-rich foods.   °· Stress or travel.   °· Lack of physical activity or exercise.   °· Ignoring the urge to have a bowel movement.   °· Using laxatives too much.   °SIGNS AND SYMPTOMS  °· Having fewer than three bowel movements a week.   °· Straining to have a bowel movement.   °· Having stools that are hard, dry, or larger than normal.   °· Feeling full or bloated.   °· Pain in the lower abdomen.   °· Not feeling relief after having a bowel movement.   °DIAGNOSIS  °Your health care provider will take a medical history and perform a physical exam. Further testing may be done for severe constipation. Some tests may include: °· A barium enema X-ray to examine your rectum, colon, and, sometimes, your small intestine.   °· A sigmoidoscopy to examine your lower colon.   °· A colonoscopy to examine your entire colon. °TREATMENT  °Treatment will depend on the severity of your constipation and what is causing it. Some dietary treatments include drinking more fluids and eating more fiber-rich foods. Lifestyle treatments may include regular exercise. If these diet and lifestyle recommendations do not help, your health care  provider may recommend taking over-the-counter laxative medicines to help you have bowel movements. Prescription medicines may be prescribed if over-the-counter medicines do not work.  °HOME CARE INSTRUCTIONS  °· Eat foods that have a lot of fiber, such as fruits, vegetables, whole grains, and beans. °· Limit foods high in fat and processed sugars, such as french fries, hamburgers, cookies, candies, and soda.   °· A fiber supplement may be added to your diet if you cannot get enough fiber from foods.   °· Drink enough fluids to keep your urine clear or pale yellow.   °· Exercise regularly or as directed by your health care provider.   °· Go to the restroom when you have the urge to go. Do not hold it.   °· Only take over-the-counter or prescription medicines as directed by your health care provider. Do not take other medicines for constipation without talking to your health care provider first.   °SEEK IMMEDIATE MEDICAL CARE IF:  °· You have bright red blood in your stool.   °· Your constipation lasts for more than 4 days or gets worse.   °· You have abdominal or rectal pain.   °· You have thin, pencil-like stools.   °· You have unexplained weight loss. °MAKE SURE YOU:  °· Understand these instructions. °· Will watch your condition. °· Will get help right away if you are not doing well or get worse. °Document Released: 07/30/2004 Document Revised: 11/06/2013 Document Reviewed: 08/13/2013 °ExitCare® Patient Information ©2015 [employer]. This information is not intended to replace advice given to you by your health care provider. Make sure you discuss any questions   you have with your health care provider.  Tennis Elbow Your caregiver has diagnosed you with a condition often referred to as "tennis elbow." This results from small tears or soreness (inflammation) at the start (origin) of the extensor muscles of the forearm. Although the condition is often called tennis or golfer's elbow, it is caused by any  repetitive action performed by your elbow. HOME CARE INSTRUCTIONS  If the condition has been short lived, rest may be the only treatment required. Using your opposite hand or arm to perform the task may help. Even changing your grip may help rest the extremity. These may even prevent the condition from recurring.  Longer standing problems, however, will often be relieved faster by:  Using anti-inflammatory agents.  Applying ice packs for 30 minutes at the end of the working day, at bed time, or when activities are finished.  Your caregiver may also have you wear a splint or sling. This will allow the inflamed tendon to heal. At times, steroid injections aided with a local anesthetic will be required along with splinting for 1 to 2 weeks. Two to three steroid injections will often solve the problem. In some long standing cases, the inflamed tendon does not respond to conservative (non-surgical) therapy. Then surgery may be required to repair it. MAKE SURE YOU:   Understand these instructions.  Will watch your condition.  Will get help right away if you are not doing well or get worse. Document Released: 11/01/2005 Document Revised: 01/24/2012 Document Reviewed: 06/19/2008 [facility] Patient Information 2015 [place], [place]. This information is not intended to replace advice given to you by your health care provider. Make sure you discuss any questions you have with your health care provider.

## 2014-07-16 NOTE — ED Provider Notes (Signed)
CSN: [id number]     Arrival date & time 07/09/14  1149 History   First MD Initiated Contact with Patient 07/09/14 1258     Chief Complaint  Patient presents with  . Abdominal Pain     (Consider location/radiation/quality/duration/timing/severity/associated sxs/prior Treatment) HPI  37yM with constipation. Hx of IBS and reports typically has several BM per day. Has not had BM in over week. Crampy abdominal pain. Worst on L. Has tried enema and OTC and a couple doses of laxative/stool softener without result. No n/v. No distension. No recent med changes aside from aforementioned. No fever or chills. No urinary complaints. Surgical hx significant for appendectomy.    Past Medical History  Diagnosis Date  . Diabetes mellitus without complication   . Hypertension   . Foot ulcer, left     secondary to DM  . IBS (irritable bowel syndrome)    Past Surgical History  Procedure Laterality Date  . Appendectomy     History reviewed. No pertinent family history. History  Substance Use Topics  . Smoking status: Current Every Day Smoker -- 1.00 packs/day    Types: Cigarettes  . Smokeless tobacco: Not on file  . Alcohol Use: No     Comment: 2times a week/beer    Review of Systems  All systems reviewed and negative, other than as noted in HPI.   Allergies  Review of patient's allergies indicates no known allergies.  Home Medications   Prior to Admission medications   Medication Sig Start Date End Date Taking? Authorizing Provider  Aspirin-Salicylamide-Caffeine (BC HEADACHE POWDER PO) Take 1 packet by mouth as needed (headache).   Yes Historical Provider, MD  gabapentin (NEURONTIN) 300 MG capsule Take 300 mg by mouth daily.   Yes Historical Provider, MD  lisinopril (PRINIVIL,ZESTRIL) 10 MG tablet Take 1 tablet (10 mg total) by mouth daily. 04/11/14  Yes Burgess Amor, PA-C  metFORMIN (GLUCOPHAGE) 500 MG tablet Take 1 tablet (500 mg total) by mouth 2 (two) times daily with a meal. 04/11/14   Yes Burgess Amor, PA-C  vitamin B-12 (CYANOCOBALAMIN) 1000 MCG tablet Take 1,000 mcg by mouth daily.   Yes Historical Provider, MD  ibuprofen (ADVIL,MOTRIN) 200 MG tablet Take 400 mg by mouth every 6 (six) hours as needed for pain.     Historical Provider, MD   BP 125/85  Pulse 106  Temp(Src) 98.4 F (36.9 C) (Oral)  Resp 16  Ht  (1.803 m)  Wt 172 lb (78.019 kg)  BMI 24.00 kg/m2  SpO2 100% Physical Exam  Nursing note and vitals reviewed. Constitutional: He appears well-developed and well-nourished. No distress.  HENT:  Head: Normocephalic and atraumatic.  Eyes: Conjunctivae are normal. Right eye exhibits no discharge. Left eye exhibits no discharge.  Neck: Neck supple.  Cardiovascular: Normal rate, regular rhythm and normal heart sounds.  Exam reveals no gallop and no friction rub.   No murmur heard. Pulmonary/Chest: Effort normal and breath sounds normal. No respiratory distress.  Abdominal: Soft. He exhibits no distension. There is no tenderness.  Musculoskeletal: He exhibits no edema and no tenderness.  Neurological: He is alert.  Skin: Skin is warm and dry.  Psychiatric: He has a normal mood and affect. His behavior is normal. Thought content normal.    ED Course  Procedures (including critical care time) Labs Review Labs Reviewed - No data to display  Imaging Review No results found.   EKG Interpretation None      MDM   Final diagnoses:  Constipation, unspecified  constipation type  Lateral epicondylitis (tennis elbow), left    37yM with constipation. Reports hx of IBS. Benign abdominal exam. No symptoms/exam findings specifically concerning for mechanical obstruction. Discussed bowel regimen. Additionally likely lateral epicondylitis as an "oh, by the way..." complaint. Musculoskeletal exam unremarkable.  Discussed symptomatic tx of this.     Raeford Razor, MD 07/16/14 [phone]

## 2015-04-14 ENCOUNTER — Encounter (HOSPITAL_COMMUNITY): Payer: Self-pay | Admitting: Emergency Medicine

## 2015-04-14 ENCOUNTER — Emergency Department (HOSPITAL_COMMUNITY)
Admission: EM | Admit: 2015-04-14 | Discharge: 2015-04-14 | Disposition: A | Discharge status: Home / Self Care | Payer: Self-pay | Attending: Emergency Medicine | Admitting: Emergency Medicine

## 2015-04-14 ENCOUNTER — Emergency Department (HOSPITAL_COMMUNITY): Payer: Self-pay

## 2015-04-14 DIAGNOSIS — K5900 Constipation, unspecified: Secondary | ICD-10-CM | POA: Insufficient documentation

## 2015-04-14 DIAGNOSIS — R109 Unspecified abdominal pain: Secondary | ICD-10-CM

## 2015-04-14 DIAGNOSIS — I10 Essential (primary) hypertension: Secondary | ICD-10-CM | POA: Insufficient documentation

## 2015-04-14 DIAGNOSIS — Z79899 Other long term (current) drug therapy: Secondary | ICD-10-CM | POA: Insufficient documentation

## 2015-04-14 DIAGNOSIS — Z872 Personal history of diseases of the skin and subcutaneous tissue: Secondary | ICD-10-CM | POA: Insufficient documentation

## 2015-04-14 DIAGNOSIS — E119 Type 2 diabetes mellitus without complications: Secondary | ICD-10-CM | POA: Insufficient documentation

## 2015-04-14 DIAGNOSIS — Z720 Tobacco use: Secondary | ICD-10-CM | POA: Insufficient documentation

## 2015-04-14 LAB — COMPREHENSIVE METABOLIC PANEL
ALK PHOS: 48 U/L (ref 38–126)
ALT: 26 U/L (ref 17–63)
AST: 23 U/L (ref 15–41)
Albumin: 4.4 g/dL (ref 3.5–5.0)
Anion gap: 8 (ref 5–15)
BUN: 14 mg/dL (ref 6–20)
CALCIUM: 9.5 mg/dL (ref 8.9–10.3)
CHLORIDE: 103 mmol/L (ref 101–111)
CO2: 27 mmol/L (ref 22–32)
Creatinine, Ser: 0.88 mg/dL (ref 0.61–1.24)
Glucose, Bld: 116 mg/dL — ABNORMAL HIGH (ref 65–99)
Potassium: 4.3 mmol/L (ref 3.5–5.1)
Sodium: 138 mmol/L (ref 135–145)
Total Bilirubin: 0.8 mg/dL (ref 0.3–1.2)
Total Protein: 7.5 g/dL (ref 6.5–8.1)

## 2015-04-14 LAB — CBC WITH DIFFERENTIAL/PLATELET
Basophils Absolute: 0.1 10*3/uL (ref 0.0–0.1)
Basophils Relative: 1 % (ref 0–1)
EOS PCT: 2 % (ref 0–5)
Eosinophils Absolute: 0.2 10*3/uL (ref 0.0–0.7)
HCT: 46.7 % (ref 39.0–52.0)
Hemoglobin: 16.1 g/dL (ref 13.0–17.0)
LYMPHS PCT: 30 % (ref 12–46)
Lymphs Abs: 2.7 10*3/uL (ref 0.7–4.0)
MCH: 32 pg (ref 26.0–34.0)
MCHC: 34.5 g/dL (ref 30.0–36.0)
MCV: 92.8 fL (ref 78.0–100.0)
Monocytes Absolute: 1 10*3/uL (ref 0.1–1.0)
Monocytes Relative: 11 % (ref 3–12)
Neutro Abs: 5.2 10*3/uL (ref 1.7–7.7)
Neutrophils Relative %: 56 % (ref 43–77)
Platelets: 180 10*3/uL (ref 150–400)
RBC: 5.03 MIL/uL (ref 4.22–5.81)
RDW: 13.1 % (ref 11.5–15.5)
WBC: 9.2 10*3/uL (ref 4.0–10.5)

## 2015-04-14 LAB — LIPASE, BLOOD: Lipase: 26 U/L (ref 22–51)

## 2015-04-14 MED ORDER — DICYCLOMINE HCL 20 MG PO TABS: 20.0000 mg | ORAL_TABLET | Freq: Two times a day (BID) | ORAL | Status: DC

## 2015-04-14 NOTE — ED Provider Notes (Signed)
CSN: [id number]     Arrival date & time 04/14/15  1302 History   First MD Initiated Contact with Patient 04/14/15 1345     Chief Complaint  Patient presents with  . Abdominal Pain     (Consider location/radiation/quality/duration/timing/severity/associated sxs/prior Treatment) HPI Comments: The pt is a 39 y/o male - recently dx with DM - on metformin - takes lisinopril for Htn, has had 3 days of abd discomfort with decreased frequency of stooling - states that he took laxatives and enemas and had some stool passage but still has the discomfortable feeling of needing to have a BM.  He has no rectal pain, dysuria, nasuea, f/c or cough / cp / sob.  The sx are focalized to the RUQ and the R flank.  There is no postprandial pain or vomiting.  Patient is a 39 y.o. male presenting with abdominal pain. The history is provided by the patient.  Abdominal Pain   Past Medical History  Diagnosis Date  . Diabetes mellitus without complication   . Hypertension   . Foot ulcer, left     secondary to DM  . IBS (irritable bowel syndrome)    Past Surgical History  Procedure Laterality Date  . Appendectomy     History reviewed. No pertinent family history. History  Substance Use Topics  . Smoking status: Current Every Day Smoker -- 1.00 packs/day    Types: Cigarettes  . Smokeless tobacco: Not on file  . Alcohol Use: No    Review of Systems  Gastrointestinal: Positive for abdominal pain.  All other systems reviewed and are negative.     Allergies  Review of patient's allergies indicates no known allergies.  Home Medications   Prior to Admission medications   Medication Sig Start Date End Date Taking? Authorizing Provider  lisinopril (PRINIVIL,ZESTRIL) 10 MG tablet Take 1 tablet (10 mg total) by mouth daily. 04/11/14  Yes Burgess AmorJulie Idol, PA-C  metFORMIN (GLUCOPHAGE) 500 MG tablet Take 1 tablet (500 mg total) by mouth 2 (two) times daily with a meal. 04/11/14  Yes Burgess AmorJulie Idol, PA-C  Multiple  Vitamins-Minerals (MULTIVITAMINS THER. W/MINERALS) TABS tablet Take 1 tablet by mouth daily.   Yes Historical Provider, MD  vitamin B-12 (CYANOCOBALAMIN) 1000 MCG tablet Take 1,000 mcg by mouth daily.   Yes Historical Provider, MD  dicyclomine (BENTYL) 20 MG tablet Take 1 tablet (20 mg total) by mouth 2 (two) times daily. 04/14/15   Eber HongBrian Silver Parkey, MD   BP 114/80 mmHg  Pulse 97  Temp(Src) 98.2 F (36.8 C) (Oral)  Resp 18  Ht 5\' 11"  (1.803 m)  Wt 176 lb (79.833 kg)  BMI 24.56 kg/m2  SpO2 100% Physical Exam  Constitutional: He appears well-developed and well-nourished. No distress.  HENT:  Head: Normocephalic and atraumatic.  Mouth/Throat: Oropharynx is clear and moist. No oropharyngeal exudate.  Eyes: Conjunctivae and EOM are normal. Pupils are equal, round, and reactive to light. Right eye exhibits no discharge. Left eye exhibits no discharge. No scleral icterus.  Neck: Normal range of motion. Neck supple. No JVD present. No thyromegaly present.  Cardiovascular: Normal rate, regular rhythm, normal heart sounds and intact distal pulses.  Exam reveals no gallop and no friction rub.   No murmur heard. Pulmonary/Chest: Effort normal and breath sounds normal. No respiratory distress. He has no wheezes. He has no rales.  Abdominal: Soft. Bowel sounds are normal. He exhibits no distension and no mass. There is tenderness ( minimal R lateral upper quadrant ttp, no CVA ttp, no  other ttp, no murphy's sign).  Musculoskeletal: Normal range of motion. He exhibits no edema or tenderness.  Lymphadenopathy:    He has no cervical adenopathy.  Neurological: He is alert. Coordination normal.  Skin: Skin is warm and dry. No rash noted. No erythema.  Psychiatric: He has a normal mood and affect. His behavior is normal.  Nursing note and vitals reviewed.   ED Course  Procedures (including critical care time) Labs Review Labs Reviewed  COMPREHENSIVE METABOLIC PANEL - Abnormal; Notable for the following:     Glucose, Bld 116 (*)    All other components within normal limits  LIPASE, BLOOD  CBC WITH DIFFERENTIAL/PLATELET  URINALYSIS, ROUTINE W REFLEX MICROSCOPIC (NOT AT [facility])    Imaging Review Dg Abd 1 View  04/14/2015   CLINICAL DATA:  Pt states that he has been having upper abd pain for the past few days and that he has not had a bowel movement in 4 days  EXAM: ABDOMEN - 1 VIEW  COMPARISON:  None.  FINDINGS: Normal bowel gas pattern. There is moderate increased stool in the colon.  There is a calcification that projects to the left of the L2-L3 disc likely in the lower pole of the left kidney. No other evidence of a renal stone. No ureteral stones are evident. Clips in the right lower quadrant consistent with an appendectomy. Soft tissues otherwise unremarkable.  No significant bony abnormality.  IMPRESSION: Moderate increased stool throughout the colon.  No acute finding.   Electronically Signed   By: Amie Portland M.D.   On: 04/14/2015 14:22      MDM   Final diagnoses:  Abdominal pain  Constipation, unspecified constipation type    Well appearing, VS normal, r/o constipation / biliary source with labs.  Benign abd exam.  Xray reviewed, labs normal - VS normal, pt informed, stable for d/c.  Meds given in ED:  Medications - No data to display  New Prescriptions   DICYCLOMINE (BENTYL) 20 MG TABLET    Take 1 tablet (20 mg total) by mouth 2 (two) times daily.      Eber Hong, MD 04/14/15 [id number]

## 2015-04-14 NOTE — ED Notes (Signed)
Pt states that he has been having upper abd pain for the past few days and that he has not had a bowel movement in 4 days.

## 2015-04-14 NOTE — Discharge Instructions (Signed)
Labs normal xrays - moderate stool burden (constipation)  miralax twice daily Bentyl for abdominal discomfort  Return if pain worsens.  Please call your doctor for a followup appointment within 24-48 hours. When you talk to your doctor please let them know that you were seen in the emergency department and have them acquire all of your records so that they can discuss the findings with you and formulate a treatment plan to fully care for your new and ongoing problems.  [facility]    Kari BaarsEdward Hawkins MD. Specialty: Pulmonary Disease Contact information: [address]  [address]  [place] [address]  [phone]   Syliva OvermanMargaret Simpson, MD. Specialty: [facility] Contact information: [address]  [place] [address]  [phone]   Lilyan PuntScott Luking, MD. Specialty: [facility] Contact information: [address]  [address]  [place] [address]  [phone]   Avon Gullyesfaye Fanta, MD Specialty: Internal Medicine Contact information: [address]  [place] [address]  [phone]   Catalina PizzaZach Hall, MD. Specialty: Internal Medicine Contact information: [address]  [place] [address]  [phone]   Butch PennyAngus Mcinnis, MD. Specialty: Family Medicine Contact information: [address]  [place] [address]  [phone]   John GiovanniStephen Knowlton, MD. Specialty: [facility] Contact information: [address]  [address]  [place] [address]  [phone]   Carylon Perchesoy Fagan, MD. Specialty: Internal Medicine Contact information: [address]  [address]  [place] [address]  [phone]

## 2015-09-03 ENCOUNTER — Emergency Department (HOSPITAL_COMMUNITY): Payer: Self-pay

## 2015-09-03 ENCOUNTER — Emergency Department (HOSPITAL_COMMUNITY)
Admission: EM | Admit: 2015-09-03 | Discharge: 2015-09-03 | Discharge status: Against Medical Advice | Payer: Self-pay | Attending: Emergency Medicine | Admitting: Emergency Medicine

## 2015-09-03 ENCOUNTER — Emergency Department (HOSPITAL_COMMUNITY)
Admission: EM | Admit: 2015-09-03 | Discharge: 2015-09-04 | Disposition: A | Discharge status: Home / Self Care | Payer: Self-pay | Attending: Emergency Medicine | Admitting: Emergency Medicine

## 2015-09-03 ENCOUNTER — Encounter (HOSPITAL_COMMUNITY): Payer: Self-pay | Admitting: *Deleted

## 2015-09-03 ENCOUNTER — Encounter (HOSPITAL_COMMUNITY): Payer: Self-pay | Admitting: Emergency Medicine

## 2015-09-03 DIAGNOSIS — E119 Type 2 diabetes mellitus without complications: Secondary | ICD-10-CM | POA: Insufficient documentation

## 2015-09-03 DIAGNOSIS — I10 Essential (primary) hypertension: Secondary | ICD-10-CM | POA: Insufficient documentation

## 2015-09-03 DIAGNOSIS — Z872 Personal history of diseases of the skin and subcutaneous tissue: Secondary | ICD-10-CM | POA: Insufficient documentation

## 2015-09-03 DIAGNOSIS — K589 Irritable bowel syndrome without diarrhea: Secondary | ICD-10-CM | POA: Insufficient documentation

## 2015-09-03 DIAGNOSIS — N200 Calculus of kidney: Secondary | ICD-10-CM | POA: Insufficient documentation

## 2015-09-03 DIAGNOSIS — Z720 Tobacco use: Secondary | ICD-10-CM | POA: Insufficient documentation

## 2015-09-03 DIAGNOSIS — Z79899 Other long term (current) drug therapy: Secondary | ICD-10-CM | POA: Insufficient documentation

## 2015-09-03 DIAGNOSIS — R109 Unspecified abdominal pain: Secondary | ICD-10-CM | POA: Insufficient documentation

## 2015-09-03 LAB — URINE MICROSCOPIC-ADD ON

## 2015-09-03 LAB — CBC WITH DIFFERENTIAL/PLATELET
BASOS PCT: 1 %
Basophils Absolute: 0.1 10*3/uL (ref 0.0–0.1)
EOS ABS: 0.1 10*3/uL (ref 0.0–0.7)
Eosinophils Relative: 1 %
HEMATOCRIT: 41.2 % (ref 39.0–52.0)
Hemoglobin: 14.2 g/dL (ref 13.0–17.0)
LYMPHS ABS: 2.9 10*3/uL (ref 0.7–4.0)
Lymphocytes Relative: 25 %
MCH: 32.3 pg (ref 26.0–34.0)
MCHC: 34.5 g/dL (ref 30.0–36.0)
MCV: 93.6 fL (ref 78.0–100.0)
Monocytes Absolute: 1 10*3/uL (ref 0.1–1.0)
Monocytes Relative: 8 %
NEUTROS ABS: 7.5 10*3/uL (ref 1.7–7.7)
Neutrophils Relative %: 65 %
Platelets: 134 10*3/uL — ABNORMAL LOW (ref 150–400)
RBC: 4.4 MIL/uL (ref 4.22–5.81)
RDW: 13 % (ref 11.5–15.5)
WBC: 11.6 10*3/uL — ABNORMAL HIGH (ref 4.0–10.5)

## 2015-09-03 LAB — COMPREHENSIVE METABOLIC PANEL
ALBUMIN: 3.9 g/dL (ref 3.5–5.0)
ALT: 36 U/L (ref 17–63)
ANION GAP: 6 (ref 5–15)
AST: 25 U/L (ref 15–41)
Alkaline Phosphatase: 42 U/L (ref 38–126)
BUN: 14 mg/dL (ref 6–20)
CO2: 25 mmol/L (ref 22–32)
Calcium: 9 mg/dL (ref 8.9–10.3)
Chloride: 110 mmol/L (ref 101–111)
Creatinine, Ser: 0.7 mg/dL (ref 0.61–1.24)
GFR calc Af Amer: >60 mL/min (ref >60)
GFR calc non Af Amer: >60 mL/min (ref >60)
GLUCOSE: 95 mg/dL (ref 65–99)
Potassium: 3.8 mmol/L (ref 3.5–5.1)
SODIUM: 141 mmol/L (ref 135–145)
Total Bilirubin: 0.6 mg/dL (ref 0.3–1.2)
Total Protein: 6.1 g/dL — ABNORMAL LOW (ref 6.5–8.1)

## 2015-09-03 LAB — URINALYSIS, ROUTINE W REFLEX MICROSCOPIC
Bilirubin Urine: NEGATIVE
Glucose, UA: NEGATIVE mg/dL
Glucose, UA: NEGATIVE mg/dL
Ketones, ur: NEGATIVE mg/dL
Ketones, ur: NEGATIVE mg/dL
LEUKOCYTES UA: NEGATIVE
LEUKOCYTES UA: NEGATIVE
Nitrite: NEGATIVE
Nitrite: NEGATIVE
PROTEIN: 30 mg/dL — AB
UROBILINOGEN UA: 0.2 mg/dL (ref 0.0–1.0)
Urobilinogen, UA: 2 mg/dL — ABNORMAL HIGH (ref 0.0–1.0)
pH: 6.5 (ref 5.0–8.0)
pH: 6 (ref 5.0–8.0)

## 2015-09-03 MED ORDER — SODIUM CHLORIDE 0.9 % IV BOLUS (SEPSIS)
1000.0000 mL | Freq: Once | INTRAVENOUS | Status: AC
  Administered 2015-09-03: 1000 mL via INTRAVENOUS

## 2015-09-03 MED ORDER — ONDANSETRON HCL 4 MG/2ML IJ SOLN
4.0000 mg | Freq: Once | INTRAMUSCULAR | Status: AC
  Administered 2015-09-03: 4 mg via INTRAVENOUS
  Filled 2015-09-03: qty 2

## 2015-09-03 MED ORDER — KETOROLAC TROMETHAMINE 30 MG/ML IJ SOLN
30.0000 mg | Freq: Once | INTRAMUSCULAR | Status: AC
  Administered 2015-09-03: 30 mg via INTRAVENOUS
  Filled 2015-09-03: qty 1

## 2015-09-03 NOTE — ED Notes (Signed)
Pt states that he believes he has been urinating blood  - off and on for past 4 days -- denies pain with urination , Pain in bilateral flanks started yesterday .

## 2015-09-03 NOTE — ED Notes (Addendum)
Pt c/o bilateral flank pain and blood in his urine. Pt was here earlier and had his ct renal study done and urine completed. Pt was not in waiting room when he was called.

## 2015-09-04 MED ORDER — TAMSULOSIN HCL 0.4 MG PO CAPS: 0.4000 mg | ORAL_CAPSULE | Freq: Every day | ORAL | Status: DC

## 2015-09-04 MED ORDER — HYDROCODONE-ACETAMINOPHEN 5-325 MG PO TABS: 1.0000 | ORAL_TABLET | ORAL | Status: DC | PRN

## 2015-09-04 MED ORDER — NAPROXEN 500 MG PO TABS: 500.0000 mg | ORAL_TABLET | Freq: Two times a day (BID) | ORAL | Status: DC

## 2015-09-04 MED ORDER — ONDANSETRON 4 MG PO TBDP: 4.0000 mg | ORAL_TABLET | Freq: Three times a day (TID) | ORAL | Status: DC | PRN

## 2015-09-04 NOTE — Discharge Instructions (Signed)
Kidney Stones °Kidney stones (urolithiasis) are deposits that form inside your kidneys. The intense pain is caused by the stone moving through the urinary tract. When the stone moves, the ureter goes into spasm around the stone. The stone is usually passed in the urine.  °CAUSES  °· A disorder that makes certain neck glands produce too much parathyroid hormone (primary hyperparathyroidism). °· A buildup of uric acid crystals, similar to gout in your joints. °· Narrowing (stricture) of the ureter. °· A kidney obstruction present at birth (congenital obstruction). °· Previous surgery on the kidney or ureters. °· Numerous kidney infections. °SYMPTOMS  °· Feeling sick to your stomach (nauseous). °· Throwing up (vomiting). °· Blood in the urine (hematuria). °· Pain that usually spreads (radiates) to the groin. °· Frequency or urgency of urination. °DIAGNOSIS  °· Taking a history and physical exam. °· Blood or urine tests. °· CT scan. °· Occasionally, an examination of the inside of the urinary bladder (cystoscopy) is performed. °TREATMENT  °· Observation. °· Increasing your fluid intake. °· Extracorporeal shock wave lithotripsy--This is a noninvasive procedure that uses shock waves to break up kidney stones. °· Surgery may be needed if you have severe pain or persistent obstruction. There are various surgical procedures. Most of the procedures are performed with the use of small instruments. Only small incisions are needed to accommodate these instruments, so recovery time is minimized. °The size, location, and chemical composition are all important variables that will determine the proper choice of action for you. Talk to your health care provider to better understand your situation so that you will minimize the risk of injury to yourself and your kidney.  °HOME CARE INSTRUCTIONS  °· Drink enough water and fluids to keep your urine clear or pale yellow. This will help you to pass the stone or stone fragments. °· Strain  all urine through the provided strainer. Keep all particulate matter and stones for your health care provider to see. The stone causing the pain may be as small as a grain of salt. It is very important to use the strainer each and every time you pass your urine. The collection of your stone will allow your health care provider to analyze it and verify that a stone has actually passed. The stone analysis will often identify what you can do to reduce the incidence of recurrences. °· Only take over-the-counter or prescription medicines for pain, discomfort, or fever as directed by your health care provider. °· Keep all follow-up visits as told by your health care provider. This is important. °· Get follow-up X-rays if required. The absence of pain does not always mean that the stone has passed. It may have only stopped moving. If the urine remains completely obstructed, it can cause loss of kidney function or even complete destruction of the kidney. It is your responsibility to make sure X-rays and follow-ups are completed. Ultrasounds of the kidney can show blockages and the status of the kidney. Ultrasounds are not associated with any radiation and can be performed easily in a matter of minutes. °· Make changes to your daily diet as told by your health care provider. You may be told to: °¨ Limit the amount of salt that you eat. °¨ Eat 5 or more servings of fruits and vegetables each day. °¨ Limit the amount of meat, poultry, fish, and eggs that you eat. °· Collect a 24-hour urine sample as told by your health care provider. You may need to collect another urine sample every 6-12   months. °SEEK MEDICAL CARE IF: °· You experience pain that is progressive and unresponsive to any pain medicine you have been prescribed. °SEEK IMMEDIATE MEDICAL CARE IF:  °· Pain cannot be controlled with the prescribed medicine. °· You have a fever or shaking chills. °· The severity or intensity of pain increases over 18 hours and is not  relieved by pain medicine. °· You develop a new onset of abdominal pain. °· You feel faint or pass out. °· You are unable to urinate. °  °This information is not intended to replace advice given to you by your health care provider. Make sure you discuss any questions you have with your health care provider. °  °Document Released: 11/01/2005 Document Revised: 07/23/2015 Document Reviewed: 04/04/2013 °[employer] Interactive Patient Education ©2016 [employer] ° °

## 2015-09-04 NOTE — ED Provider Notes (Signed)
CSN: [id number]     Arrival date & time 09/03/15  2034 History   First MD Initiated Contact with Patient 09/03/15 2222     Chief Complaint  Patient presents with  . Flank Pain     (Consider location/radiation/quality/duration/timing/severity/associated sxs/prior Treatment) Patient is a 39 y.o. male presenting with flank pain.  Flank Pain This is a new problem. Episode onset: yesterday, worsened today. 4 days of heamturia. The problem occurs constantly. The problem has been gradually worsening. Pertinent negatives include no chest pain, no abdominal pain, no headaches and no shortness of breath. Nothing aggravates the symptoms. Nothing relieves the symptoms. He has tried nothing for the symptoms.    Past Medical History  Diagnosis Date  . Diabetes mellitus without complication (HCC)   . Hypertension   . Foot ulcer, left (HCC)     secondary to DM  . IBS (irritable bowel syndrome)    Past Surgical History  Procedure Laterality Date  . Appendectomy     History reviewed. No pertinent family history. Social History  Substance Use Topics  . Smoking status: Current Every Day Smoker -- 1.00 packs/day    Types: Cigarettes  . Smokeless tobacco: None  . Alcohol Use: No    Review of Systems  Constitutional: Negative for fever.  HENT: Negative for sore throat.   Eyes: Negative for visual disturbance.  Respiratory: Negative for shortness of breath.   Cardiovascular: Negative for chest pain.  Gastrointestinal: Positive for nausea. Negative for vomiting, abdominal pain, diarrhea and constipation.  Genitourinary: Positive for hematuria (4 days) and flank pain (bilatera). Negative for difficulty urinating.  Musculoskeletal: Negative for back pain and neck stiffness.  Skin: Negative for rash.  Neurological: Negative for syncope and headaches.      Allergies  Review of patient's allergies indicates no known allergies.  Home Medications   Prior to Admission medications    Medication Sig Start Date End Date Taking? Authorizing Provider  dicyclomine (BENTYL) 20 MG tablet Take 1 tablet (20 mg total) by mouth 2 (two) times daily. 04/14/15   Eber HongBrian Miller, MD  HYDROcodone-acetaminophen (NORCO/VICODIN) 5-325 MG tablet Take 1 tablet by mouth every 4 (four) hours as needed. 09/04/15   Alvira MondayErin Vannak Montenegro, MD  lisinopril (PRINIVIL,ZESTRIL) 10 MG tablet Take 1 tablet (10 mg total) by mouth daily. 04/11/14   Burgess AmorJulie Idol, PA-C  metFORMIN (GLUCOPHAGE) 500 MG tablet Take 1 tablet (500 mg total) by mouth 2 (two) times daily with a meal. 04/11/14   Burgess AmorJulie Idol, PA-C  Multiple Vitamins-Minerals (MULTIVITAMINS THER. W/MINERALS) TABS tablet Take 1 tablet by mouth daily.    Historical Provider, MD  naproxen (NAPROSYN) 500 MG tablet Take 1 tablet (500 mg total) by mouth 2 (two) times daily with a meal. 09/04/15   Alvira MondayErin Kaira Stringfield, MD  ondansetron (ZOFRAN ODT) 4 MG disintegrating tablet Take 1 tablet (4 mg total) by mouth every 8 (eight) hours as needed for nausea or vomiting. 09/04/15   Alvira MondayErin Avaeh Ewer, MD  tamsulosin (FLOMAX) 0.4 MG CAPS capsule Take 1 capsule (0.4 mg total) by mouth daily. 09/04/15   Alvira MondayErin Salvatore Poe, MD  vitamin B-12 (CYANOCOBALAMIN) 1000 MCG tablet Take 1,000 mcg by mouth daily.    Historical Provider, MD   BP 122/81 mmHg  Pulse 71  Temp(Src) 98 F (36.7 C) (Oral)  Resp 18  Ht 5\' 11"  (1.803 m)  Wt 173 lb (78.472 kg)  BMI 24.14 kg/m2  SpO2 99% Physical Exam  Constitutional: He is oriented to person, place, and time. He appears well-developed and  well-nourished. No distress.  HENT:  Head: Normocephalic and atraumatic.  Eyes: Conjunctivae and EOM are normal.  Neck: Normal range of motion.  Cardiovascular: Normal rate, regular rhythm, normal heart sounds and intact distal pulses.  Exam reveals no gallop and no friction rub.   No murmur heard. Pulmonary/Chest: Effort normal and breath sounds normal. No respiratory distress. He has no wheezes. He has no rales.   Abdominal: Soft. He exhibits no distension. There is tenderness. There is CVA tenderness (bilateral). There is no guarding, no tenderness at McBurney's point and negative Murphy's sign.  Musculoskeletal: He exhibits no edema.  Neurological: He is alert and oriented to person, place, and time.  Skin: Skin is warm and dry. He is not diaphoretic.  Nursing note and vitals reviewed.   ED Course  Procedures (including critical care time) Labs Review Labs Reviewed  COMPREHENSIVE METABOLIC PANEL - Abnormal; Notable for the following:    Total Protein 6.1 (*)    All other components within normal limits  CBC WITH DIFFERENTIAL/PLATELET - Abnormal; Notable for the following:    WBC 11.6 (*)    Platelets 134 (*)    All other components within normal limits  URINALYSIS, ROUTINE W REFLEX MICROSCOPIC (NOT AT [facility]) - Abnormal; Notable for the following:    Color, Urine AMBER (*)    APPearance HAZY (*)    Specific Gravity, Urine >1.030 (*)    Hgb urine dipstick LARGE (*)    Bilirubin Urine SMALL (*)    Protein, ur 30 (*)    Urobilinogen, UA 2.0 (*)    All other components within normal limits  URINE CULTURE  URINE MICROSCOPIC-ADD ON    Imaging Review Ct Renal Stone Study  09/03/2015  CLINICAL DATA:  Patient with bilateral flank pain and hematuria for 4 days. EXAM: CT ABDOMEN AND PELVIS WITHOUT CONTRAST TECHNIQUE: Multidetector CT imaging of the abdomen and pelvis was performed following the standard protocol without IV contrast. COMPARISON:  None. FINDINGS: Lower chest: Lung bases show no acute findings. Heart size normal. No pericardial or pleural effusion. Hepatobiliary: Liver is unremarkable. Small stone in the gallbladder. No biliary ductal dilatation. Pancreas: Negative. Spleen: Negative. Adrenals/Urinary Tract: Adrenal glands are unremarkable. There may be a punctate stone in the right kidney. Right ureter is decompressed. Mild left hydronephrosis secondary to a 5 mm stone at the left  ureteral pelvic junction. Left ureter is otherwise decompressed. Bladder is somewhat low in volume which may account for bladder wall thickening. Stomach/Bowel: Stomach and small bowel are unremarkable. Appendix appears surgically absent. Stool is seen in the majority of the colon. Vascular/Lymphatic: Minimal atherosclerotic calcification of the arterial vasculature. Scattered lymph nodes are not enlarged by CT size criteria. Reproductive: Prostate is normal in size. Other: No free fluid.  Mesenteries and peritoneum are unremarkable. Musculoskeletal: No worrisome lytic or sclerotic lesions. IMPRESSION: 1. Mild left hydronephrosis secondary to a 5 mm stone at the left ureteral pelvic junction. 2. Punctate right renal stone, nonobstructing. 3. Cholelithiasis. 4. Stool in the majority of the colon is indicative of constipation. Electronically Signed   By: Leanna Battles M.D.   On: 09/03/2015 19:23   I have personally reviewed and evaluated these images and lab results as part of my medical decision-making.   EKG Interpretation None      MDM   Final diagnoses:  Left nephrolithiasis, 5mm UPJ stone   39yo male with hx of DM, htn presents with concern for hematuria and flank pain. Hx, physical and imaging consistent with  nephrolithiasis. CT stone study done in waiting room shows 5mm obstructing left UPJ stone. Obtained labs showing normal renal function. No sign of UTI.  Pain improved with toradol and IVF.  Discussed nephrolithiasis care and provided rx for naproxen, norco, zofran, flomax. Provided number for Urology if pain persists.  Patient discharged in stable condition with understanding of reasons to return.     Alvira Monday, MD 09/04/15 1324

## 2015-09-05 LAB — URINE CULTURE: Culture: NO GROWTH

## 2016-01-24 ENCOUNTER — Other Ambulatory Visit: Payer: Self-pay | Admitting: Physician Assistant

## 2016-02-17 ENCOUNTER — Encounter: Payer: Self-pay | Admitting: Physician Assistant

## 2016-02-17 ENCOUNTER — Ambulatory Visit: Payer: Self-pay | Admitting: Physician Assistant

## 2016-02-17 VITALS — BP 120/80 | HR 74 | Temp 98.1°F | Ht 69.0 in | Wt 167.4 lb

## 2016-02-17 DIAGNOSIS — F17210 Nicotine dependence, cigarettes, uncomplicated: Secondary | ICD-10-CM | POA: Insufficient documentation

## 2016-02-17 DIAGNOSIS — F419 Anxiety disorder, unspecified: Secondary | ICD-10-CM | POA: Insufficient documentation

## 2016-02-17 DIAGNOSIS — E119 Type 2 diabetes mellitus without complications: Secondary | ICD-10-CM

## 2016-02-17 LAB — COMPLETE METABOLIC PANEL WITH GFR
ALBUMIN: 4.5 g/dL (ref 3.6–5.1)
ALK PHOS: 57 U/L (ref 40–115)
ALT: 11 U/L (ref 9–46)
AST: 19 U/L (ref 10–40)
BUN: 15 mg/dL (ref 7–25)
CALCIUM: 9.7 mg/dL (ref 8.6–10.3)
CO2: 26 mmol/L (ref 20–31)
Chloride: 106 mmol/L (ref 98–110)
Creat: 1.01 mg/dL (ref 0.60–1.35)
Glucose, Bld: 82 mg/dL (ref 65–99)
POTASSIUM: 4.1 mmol/L (ref 3.5–5.3)
Sodium: 141 mmol/L (ref 135–146)
Total Bilirubin: 0.6 mg/dL (ref 0.2–1.2)
Total Protein: 6.8 g/dL (ref 6.1–8.1)

## 2016-02-17 LAB — HEMOGLOBIN A1C
HEMOGLOBIN A1C: 5.1 % (ref <5.7)
MEAN PLASMA GLUCOSE: 100 mg/dL

## 2016-02-17 MED ORDER — LISINOPRIL 10 MG PO TABS: 10.0000 mg | ORAL_TABLET | Freq: Every day | ORAL | Status: DC

## 2016-02-17 NOTE — Progress Notes (Signed)
BP 120/80 mmHg  Pulse 74  Temp(Src) 98.1 F (36.7 C)  Ht  (1.753 m)  Wt 167 lb 6.4 oz (75.932 kg)  BMI 24.71 kg/m2  SpO2 98%   Subjective:    Patient ID: Jeffery Robertson, male    DOB: [birth year], 40 y.o.   MRN: [id number]  HPI: Jeffery Robertson is a 40 y.o. male presenting on 02/17/2016 for Diabetes   HPI  Pt last seen in office May 15, 2015.  He was supposed to get labs drawn that day when he left the office but he didn't.  He was supposed to  f/u in November but didn't.  Pt ran out of his lisinopril about a month ago  Pt states some anxiety.  He thinks it may be related to running out of his lisinopril.  Relevant past medical, surgical, family and social history reviewed and updated as indicated. Interim medical history since our last visit reviewed. Allergies and medications reviewed and updated.  Current outpatient prescriptions:  .  metFORMIN (GLUCOPHAGE) 500 MG tablet, Take 1 tablet (500 mg total) by mouth 2 (two) times daily with a meal., Disp: 60 tablet, Rfl: 0 .  Multiple Vitamins-Minerals (MULTIVITAMINS THER. W/MINERALS) TABS tablet, Take 1 tablet by mouth daily., Disp: , Rfl:  .  vitamin B-12 (CYANOCOBALAMIN) 1000 MCG tablet, Take 1,000 mcg by mouth daily., Disp: , Rfl:  (pt taking  metformin bid)    Review of Systems  Constitutional: Positive for fatigue. Negative for fever, chills, diaphoresis, appetite change and unexpected weight change.  HENT: Positive for dental problem. Negative for congestion, drooling, ear pain, facial swelling, hearing loss, mouth sores, sneezing, sore throat, trouble swallowing and voice change.   Eyes: Negative for pain, discharge, redness, itching and visual disturbance.  Respiratory: Positive for shortness of breath. Negative for cough, choking and wheezing.   Cardiovascular: Negative for chest pain, palpitations and leg swelling.  Gastrointestinal: Negative for vomiting, abdominal pain, diarrhea, constipation and blood in  stool.  Endocrine: Negative for cold intolerance, heat intolerance and polydipsia.  Genitourinary: Negative for dysuria, hematuria and decreased urine volume.  Musculoskeletal: Positive for back pain. Negative for arthralgias and gait problem.  Skin: Negative for rash.  Allergic/Immunologic: Positive for environmental allergies.  Neurological: Negative for seizures, syncope, light-headedness and headaches.  Hematological: Negative for adenopathy.  Psychiatric/Behavioral: Positive for dysphoric mood. Negative for suicidal ideas and agitation. The patient is not nervous/anxious.     Per HPI unless specifically indicated above     Objective:    BP 120/80 mmHg  Pulse 74  Temp(Src) 98.1 F (36.7 C)  Ht  (1.753 m)  Wt 167 lb 6.4 oz (75.932 kg)  BMI 24.71 kg/m2  SpO2 98%  Wt Readings from Last 3 Encounters:  02/17/16 167 lb 6.4 oz (75.932 kg)  09/03/15 173 lb (78.472 kg)  09/03/15 174 lb (78.926 kg)    Physical Exam  Constitutional: He is oriented to person, place, and time. He appears well-developed and well-nourished.  HENT:  Head: Normocephalic and atraumatic.  Neck: Neck supple.  Cardiovascular: Normal rate and regular rhythm.   Pulmonary/Chest: Effort normal and breath sounds normal. He has no wheezes.  Abdominal: Soft. Bowel sounds are normal. There is no hepatosplenomegaly. There is no tenderness.  Musculoskeletal: He exhibits no edema.  Lymphadenopathy:    He has no cervical adenopathy.  Neurological: He is alert and oriented to person, place, and time.  Skin: Skin is warm and dry.  Psychiatric: He has a  normal mood and affect. His behavior is normal.  Vitals reviewed.  DM foot exam done     Assessment & Plan:   Encounter Diagnoses  Name Primary?  . Type 2 diabetes mellitus without complication, unspecified long term insulin use status (HCC) Yes  . Cigarette nicotine dependence without complication   . Anxiety disorder, unspecified anxiety disorder type      -pt to get fasting Labs - will call with results -will order annual DM eye exam -Gave pt [facility] card for him to call to get counseling for his anxiety -restart lisinopril -Counseled smoking cessation -F/u 3 months.  RTO sooner prn

## 2016-02-18 ENCOUNTER — Other Ambulatory Visit: Payer: Self-pay | Admitting: Physician Assistant

## 2016-02-18 DIAGNOSIS — E119 Type 2 diabetes mellitus without complications: Secondary | ICD-10-CM

## 2016-02-18 LAB — MICROALBUMIN, URINE

## 2016-02-22 ENCOUNTER — Emergency Department (HOSPITAL_COMMUNITY)
Admission: EM | Admit: 2016-02-22 | Discharge: 2016-02-22 | Disposition: A | Discharge status: Home / Self Care | Payer: Self-pay | Attending: Emergency Medicine | Admitting: Emergency Medicine

## 2016-02-22 ENCOUNTER — Emergency Department (HOSPITAL_COMMUNITY): Payer: Self-pay

## 2016-02-22 ENCOUNTER — Encounter (HOSPITAL_COMMUNITY): Payer: Self-pay | Admitting: Emergency Medicine

## 2016-02-22 DIAGNOSIS — R0789 Other chest pain: Secondary | ICD-10-CM | POA: Insufficient documentation

## 2016-02-22 DIAGNOSIS — J069 Acute upper respiratory infection, unspecified: Secondary | ICD-10-CM | POA: Insufficient documentation

## 2016-02-22 DIAGNOSIS — I10 Essential (primary) hypertension: Secondary | ICD-10-CM | POA: Insufficient documentation

## 2016-02-22 DIAGNOSIS — Z79899 Other long term (current) drug therapy: Secondary | ICD-10-CM | POA: Insufficient documentation

## 2016-02-22 DIAGNOSIS — Z7982 Long term (current) use of aspirin: Secondary | ICD-10-CM | POA: Insufficient documentation

## 2016-02-22 DIAGNOSIS — F17210 Nicotine dependence, cigarettes, uncomplicated: Secondary | ICD-10-CM | POA: Insufficient documentation

## 2016-02-22 DIAGNOSIS — Z7984 Long term (current) use of oral hypoglycemic drugs: Secondary | ICD-10-CM | POA: Insufficient documentation

## 2016-02-22 DIAGNOSIS — R0602 Shortness of breath: Secondary | ICD-10-CM | POA: Insufficient documentation

## 2016-02-22 DIAGNOSIS — E119 Type 2 diabetes mellitus without complications: Secondary | ICD-10-CM | POA: Insufficient documentation

## 2016-02-22 LAB — CBG MONITORING, ED: Glucose-Capillary: 83 mg/dL (ref 65–99)

## 2016-02-22 LAB — CBC WITH DIFFERENTIAL/PLATELET
BASOS PCT: 0 %
Basophils Absolute: 0.1 10*3/uL (ref 0.0–0.1)
EOS ABS: 0 10*3/uL (ref 0.0–0.7)
Eosinophils Relative: 0 %
HEMATOCRIT: 45.6 % (ref 39.0–52.0)
HEMOGLOBIN: 16.3 g/dL (ref 13.0–17.0)
Lymphocytes Relative: 13 %
Lymphs Abs: 1.7 10*3/uL (ref 0.7–4.0)
MCH: 31.9 pg (ref 26.0–34.0)
MCHC: 35.7 g/dL (ref 30.0–36.0)
MCV: 89.2 fL (ref 78.0–100.0)
Monocytes Absolute: 1 10*3/uL (ref 0.1–1.0)
Monocytes Relative: 7 %
NEUTROS ABS: 10.2 10*3/uL — AB (ref 1.7–7.7)
NEUTROS PCT: 80 %
Platelets: 179 10*3/uL (ref 150–400)
RBC: 5.11 MIL/uL (ref 4.22–5.81)
RDW: 12.9 % (ref 11.5–15.5)
WBC: 12.9 10*3/uL — AB (ref 4.0–10.5)

## 2016-02-22 LAB — COMPREHENSIVE METABOLIC PANEL
ALBUMIN: 5 g/dL (ref 3.5–5.0)
ALK PHOS: 48 U/L (ref 38–126)
ALT: 18 U/L (ref 17–63)
ANION GAP: 10 (ref 5–15)
AST: 25 U/L (ref 15–41)
BUN: 17 mg/dL (ref 6–20)
CALCIUM: 9.4 mg/dL (ref 8.9–10.3)
CO2: 25 mmol/L (ref 22–32)
CREATININE: 1.13 mg/dL (ref 0.61–1.24)
Chloride: 103 mmol/L (ref 101–111)
GFR calc Af Amer: >60 mL/min (ref >60)
GFR calc non Af Amer: >60 mL/min (ref >60)
GLUCOSE: 100 mg/dL — AB (ref 65–99)
Potassium: 3.8 mmol/L (ref 3.5–5.1)
SODIUM: 138 mmol/L (ref 135–145)
Total Bilirubin: 1.1 mg/dL (ref 0.3–1.2)
Total Protein: 7.9 g/dL (ref 6.5–8.1)

## 2016-02-22 LAB — TROPONIN I: Troponin I: <0.03 ng/mL (ref <0.031)

## 2016-02-22 MED ORDER — TRAMADOL HCL 50 MG PO TABS
50.0000 mg | ORAL_TABLET | Freq: Once | ORAL | Status: AC
  Administered 2016-02-22: 50 mg via ORAL
  Filled 2016-02-22: qty 1

## 2016-02-22 MED ORDER — TRAMADOL HCL 50 MG PO TABS: 50.0000 mg | ORAL_TABLET | Freq: Four times a day (QID) | ORAL | Status: DC | PRN

## 2016-02-22 NOTE — ED Notes (Signed)
MD at bedside. 

## 2016-02-22 NOTE — Discharge Instructions (Signed)
Follow up if not improving

## 2016-02-22 NOTE — ED Notes (Signed)
Patient c/o upper abd pain/rib cage pain. Per patient sore. Patient also reports some shortness of breath. Denies any fevers, nausea, vomiting, or diarrhea. Patient reports occasional cough with small amount of thick green sputum.

## 2016-02-22 NOTE — ED Provider Notes (Signed)
CSN: [id number]     Arrival date & time 02/22/16  1349 History   First MD Initiated Contact with Patient 02/22/16 1519     Chief Complaint  Patient presents with  . Abdominal Pain     (Consider location/radiation/quality/duration/timing/severity/associated sxs/prior Treatment) Patient is a 40 y.o. male presenting with cough. The history is provided by the patient (Patient states he's had a minor cough for over week and has some chest tightness.).  Cough Cough characteristics:  Non-productive Onset quality:  Gradual Timing:  Constant Progression:  Waxing and waning Chronicity:  New Context: not animal exposure   Relieved by:  Nothing Associated symptoms: no chest pain, no eye discharge, no headaches and no rash     Past Medical History  Diagnosis Date  . Hypertension   . Foot ulcer, left (HCC)     secondary to DM  . IBS (irritable bowel syndrome)   . Diabetes mellitus without complication (HCC) 2014   Past Surgical History  Procedure Laterality Date  . Appendectomy     Family History  Problem Relation Age of Onset  . Cancer Mother     breast  . Heart disease Mother    Social History  Substance Use Topics  . Smoking status: Current Every Day Smoker -- 1.00 packs/day for 14 years    Types: Cigarettes  . Smokeless tobacco: Never Used  . Alcohol Use: No    Review of Systems  Constitutional: Negative for appetite change and fatigue.  HENT: Negative for congestion, ear discharge and sinus pressure.   Eyes: Negative for discharge.  Respiratory: Positive for cough.   Cardiovascular: Negative for chest pain.  Gastrointestinal: Negative for abdominal pain and diarrhea.  Genitourinary: Negative for frequency and hematuria.  Musculoskeletal: Negative for back pain.  Skin: Negative for rash.  Neurological: Negative for seizures and headaches.  Psychiatric/Behavioral: Negative for hallucinations.      Allergies  Review of patient's allergies indicates no known  allergies.  Home Medications   Prior to Admission medications   Medication Sig Start Date End Date Taking? Authorizing Provider  aspirin EC 81 MG tablet Take 81 mg by mouth every 6 (six) hours as needed for mild pain.   Yes Historical Provider, MD  lisinopril (PRINIVIL,ZESTRIL) 10 MG tablet Take 1 tablet (10 mg total) by mouth daily. 02/17/16  Yes Jacquelin Hawking, PA-C  vitamin B-12 (CYANOCOBALAMIN) 1000 MCG tablet Take 1,000 mcg by mouth daily.   Yes Historical Provider, MD  metFORMIN (GLUCOPHAGE) 500 MG tablet Take 1 tablet (500 mg total) by mouth 2 (two) times daily with a meal. Patient not taking: Reported on 02/22/2016 04/11/14   Burgess Amor, PA-C  traMADol (ULTRAM) 50 MG tablet Take 1 tablet (50 mg total) by mouth every 6 (six) hours as needed. 02/22/16   Bethann Berkshire, MD   BP 124/85 mmHg  Pulse 92  Temp(Src) 98.1 F (36.7 C) (Oral)  Resp 20  Ht  (1.803 m)  Wt 170 lb (77.111 kg)  BMI 23.72 kg/m2  SpO2 100% Physical Exam  Constitutional: He is oriented to person, place, and time. He appears well-developed.  HENT:  Head: Normocephalic.  Eyes: Conjunctivae and EOM are normal. No scleral icterus.  Neck: Neck supple. No thyromegaly present.  Cardiovascular: Normal rate and regular rhythm.  Exam reveals no gallop and no friction rub.   No murmur heard. Pulmonary/Chest: No stridor. He has no wheezes. He has no rales. He exhibits no tenderness.  Abdominal: He exhibits no distension. There is no  tenderness. There is no rebound.  Musculoskeletal: Normal range of motion. He exhibits no edema.  Lymphadenopathy:    He has no cervical adenopathy.  Neurological: He is oriented to person, place, and time. He exhibits normal muscle tone. Coordination normal.  Skin: No rash noted. No erythema.  Psychiatric: He has a normal mood and affect. His behavior is normal.    ED Course  Procedures (including critical care time) Labs Review Labs Reviewed  CBC WITH DIFFERENTIAL/PLATELET -  Abnormal; Notable for the following:    WBC 12.9 (*)    Neutro Abs 10.2 (*)    All other components within normal limits  COMPREHENSIVE METABOLIC PANEL - Abnormal; Notable for the following:    Glucose, Bld 100 (*)    All other components within normal limits  TROPONIN I  CBG MONITORING, ED    Imaging Review Dg Chest 2 View  02/22/2016  CLINICAL DATA:  40 year old with recent onset of cough, chest wall pain and shortness of breath. EXAM: CHEST  2 VIEW COMPARISON:  04/11/2014 and earlier. FINDINGS: Cardiomediastinal silhouette unremarkable, unchanged. Lungs clear. Bronchovascular markings normal. Pulmonary vascularity normal. No visible pleural effusions. No pneumothorax. Thoracic dextroscoliosis and compensatory thoracolumbar levoscoliosis, unchanged. IMPRESSION: No acute cardiopulmonary disease.  Stable examination. Electronically Signed   By: Hulan Saashomas  Lawrence M.D.   On: 02/22/2016 15:03   I have personally reviewed and evaluated these images and lab results as part of my medical decision-making.   EKG Interpretation   Date/Time:  Sunday February 22 2016 14:20:48 EDT Ventricular Rate:  100 PR Interval:  123 QRS Duration: 89 QT Interval:  335 QTC Calculation: 432 R Axis:   97 Text Interpretation:  Sinus tachycardia Borderline right axis deviation  Confirmed by Gilad Dugger  MD, Coralie Stanke ([id number]) on 02/22/2016 3:27:25 PM      MDM   Final diagnoses:  URI (upper respiratory infection)    Labs unremarkable except for slightly elevated WBC. Suspect chest discomfort is related to URI. Patient given some Ultram will follow-up as needed    Bethann BerkshireJoseph Byard Carranza, MD 02/22/16 1721

## 2016-03-17 ENCOUNTER — Ambulatory Visit: Payer: Self-pay | Admitting: Physician Assistant

## 2016-03-17 ENCOUNTER — Encounter: Payer: Self-pay | Admitting: Physician Assistant

## 2016-03-17 VITALS — BP 96/68 | HR 103 | Temp 98.1°F | Ht 69.0 in | Wt 161.8 lb

## 2016-03-17 DIAGNOSIS — J029 Acute pharyngitis, unspecified: Secondary | ICD-10-CM

## 2016-03-17 DIAGNOSIS — J302 Other seasonal allergic rhinitis: Secondary | ICD-10-CM

## 2016-03-17 LAB — POCT RAPID STREP A (OFFICE): Rapid Strep A Screen: NEGATIVE

## 2016-03-17 NOTE — Patient Instructions (Signed)
claritan or zyrtec or allegra Warm salt water gargles

## 2016-03-17 NOTE — Progress Notes (Signed)
BP 96/68 mmHg  Pulse 103  Temp(Src) 98.1 F (36.7 C)  Ht 5\' 9"  (1.753 m)  Wt 161 lb 12.8 oz (73.392 kg)  BMI 23.88 kg/m2  SpO2 98%   Subjective:    Patient ID: Jeffery Robertson Ishler, male    DOB: 09/27/1976, 40 y.o.   MRN: [id number]  HPI: Jeffery Robertson Cen is a 40 y.o. male presenting on 03/17/2016 for Sore Throat   HPI   Chief Complaint  Patient presents with  . Sore Throat    has only used cough drops for sore throat. feels like he has drainage, but his nose is not running. has cough, has pressure on forehead sometimes   Pt states symptoms for about 2 wk now.  occassional dry cough.  Relevant past medical, surgical, family and social history reviewed and updated as indicated. Interim medical history since our last visit reviewed. Allergies and medications reviewed and updated.   Current outpatient prescriptions:  .  aspirin EC 81 MG tablet, Take 81 mg by mouth every 6 (six) hours as needed for mild pain., Disp: , Rfl:  .  diphenhydrAMINE (BENADRYL) 25 MG tablet, Take 25 mg by mouth 2 (two) times daily., Disp: , Rfl:  .  lisinopril (PRINIVIL,ZESTRIL) 10 MG tablet, Take 1 tablet (10 mg total) by mouth daily., Disp: 30 tablet, Rfl: 1 .  vitamin B-12 (CYANOCOBALAMIN) 1000 MCG tablet, Take 1,000 mcg by mouth daily., Disp: , Rfl:    Review of Systems  Constitutional: Positive for fatigue. Negative for fever, chills and appetite change.  HENT: Positive for congestion and sore throat. Negative for dental problem, drooling, ear pain, facial swelling, hearing loss, mouth sores, sinus pressure and sneezing.   Eyes: Negative for pain, discharge, redness, itching and visual disturbance.  Respiratory: Positive for cough. Negative for shortness of breath and wheezing.   Cardiovascular: Negative for chest pain, palpitations and leg swelling.  Gastrointestinal: Negative for vomiting, abdominal pain, diarrhea, constipation and blood in stool.  Endocrine: Negative for cold intolerance, heat  intolerance and polydipsia.  Genitourinary: Negative for dysuria and hematuria.  Allergic/Immunologic: Positive for environmental allergies.  Neurological: Positive for headaches. Negative for seizures and syncope.  Hematological: Negative for adenopathy.  Psychiatric/Behavioral: Positive for dysphoric mood. Negative for suicidal ideas and agitation. The patient is not nervous/anxious.     Per HPI unless specifically indicated above     Objective:    BP 96/68 mmHg  Pulse 103  Temp(Src) 98.1 F (36.7 C)  Ht 5\' 9"  (1.753 m)  Wt 161 lb 12.8 oz (73.392 kg)  BMI 23.88 kg/m2  SpO2 98%  Wt Readings from Last 3 Encounters:  03/17/16 161 lb 12.8 oz (73.392 kg)  02/22/16 170 lb (77.111 kg)  02/17/16 167 lb 6.4 oz (75.932 kg)    Physical Exam  Constitutional: He is oriented to person, place, and time. He appears well-developed and well-nourished.  HENT:  Head: Normocephalic and atraumatic.  Right Ear: Hearing, tympanic membrane, external ear and ear canal normal.  Left Ear: Hearing, tympanic membrane, external ear and ear canal normal.  Nose: Rhinorrhea present. No mucosal edema or sinus tenderness. Right sinus exhibits no maxillary sinus tenderness and no frontal sinus tenderness. Left sinus exhibits frontal sinus tenderness. Left sinus exhibits no maxillary sinus tenderness.  Mouth/Throat: Uvula is midline and oropharynx is clear and moist. No uvula swelling. No oropharyngeal exudate, posterior oropharyngeal edema, posterior oropharyngeal erythema or tonsillar abscesses.  Neck: Neck supple.  Cardiovascular: Normal rate and regular rhythm.  Pulmonary/Chest: Effort normal and breath sounds normal.  Abdominal: Soft. He exhibits no distension. There is no tenderness.  Lymphadenopathy:    He has no cervical adenopathy.  Neurological: He is alert and oriented to person, place, and time.  Skin: Skin is warm and dry.  Psychiatric: He has a normal mood and affect. His behavior is normal.   Vitals reviewed.   RST negative      Assessment & Plan:     Encounter Diagnoses  Name Primary?  . Sore throat Yes  . Seasonal allergies    counseled pt. Recommended otc antihistamine like zyrtec, claritan or allegra.  Encouraged warm salt water gargles. Also recommended abstaining from smoking.  F/u as scheduled.  RTO if symptoms persist or if develops new symptoms

## 2016-04-20 ENCOUNTER — Other Ambulatory Visit: Payer: Self-pay | Admitting: Physician Assistant

## 2016-05-10 ENCOUNTER — Other Ambulatory Visit: Payer: Self-pay | Admitting: Student

## 2016-05-10 DIAGNOSIS — E119 Type 2 diabetes mellitus without complications: Secondary | ICD-10-CM

## 2016-05-12 LAB — HEMOGLOBIN A1C
Hgb A1c MFr Bld: 5 % (ref <5.7)
Mean Plasma Glucose: 97 mg/dL

## 2016-05-13 ENCOUNTER — Encounter: Payer: Self-pay | Admitting: Physician Assistant

## 2016-05-13 ENCOUNTER — Ambulatory Visit: Payer: Self-pay | Admitting: Physician Assistant

## 2016-05-13 VITALS — BP 102/64 | HR 82 | Temp 98.1°F | Ht 69.0 in | Wt 166.2 lb

## 2016-05-13 DIAGNOSIS — F17210 Nicotine dependence, cigarettes, uncomplicated: Secondary | ICD-10-CM

## 2016-05-13 DIAGNOSIS — I10 Essential (primary) hypertension: Secondary | ICD-10-CM

## 2016-05-13 NOTE — Progress Notes (Signed)
BP 102/64 mmHg  Pulse 82  Temp(Src) 98.1 F (36.7 C)  Ht 5\' 9"  (1.753 m)  Wt 166 lb 3.2 oz (75.388 kg)  BMI 24.53 kg/m2  SpO2 98%   Subjective:    Patient ID: Jeffery Robertson, male    DOB: 06/05/1976, 40 y.o.   MRN: [id number]  HPI: Jeffery Robertson is a 40 y.o. male presenting on 05/13/2016 for Follow-up   HPI   Chief Complaint  Patient presents with  . Follow-up    sores on inside of lip    R thigh sore for several months.    Also knot on top lip and place on lower lip  Relevant past medical, surgical, family and social history reviewed and updated as indicated. Interim medical history since our last visit reviewed. Allergies and medications reviewed and updated.  Current outpatient prescriptions:  .  aspirin EC 81 MG tablet, Take 81 mg by mouth every 6 (six) hours as needed for mild pain., Disp: , Rfl:  .  lisinopril (PRINIVIL,ZESTRIL) 10 MG tablet, TAKE ONE TABLET BY MOUTH ONCE DAILY, Disp: 30 tablet, Rfl: 1 .  vitamin B-12 (CYANOCOBALAMIN) 1000 MCG tablet, Take 1,000 mcg by mouth daily., Disp: , Rfl:     Review of Systems  Constitutional: Negative for fever, chills, diaphoresis, appetite change, fatigue and unexpected weight change.  HENT: Positive for dental problem and mouth sores. Negative for congestion, drooling, ear pain, facial swelling, hearing loss, sneezing, sore throat, trouble swallowing and voice change.   Eyes: Negative for pain, discharge, redness, itching and visual disturbance.  Respiratory: Negative for cough, choking, shortness of breath and wheezing.   Cardiovascular: Negative for chest pain, palpitations and leg swelling.  Gastrointestinal: Negative for vomiting, abdominal pain, diarrhea, constipation and blood in stool.  Endocrine: Negative for cold intolerance, heat intolerance and polydipsia.  Genitourinary: Negative for dysuria, hematuria and decreased urine volume.  Musculoskeletal: Positive for back pain and arthralgias. Negative for  gait problem.  Skin: Negative for rash.  Allergic/Immunologic: Negative for environmental allergies.  Neurological: Negative for seizures, syncope, light-headedness and headaches.  Hematological: Negative for adenopathy.  Psychiatric/Behavioral: Positive for dysphoric mood. Negative for suicidal ideas and agitation. The patient is not nervous/anxious.     Per HPI unless specifically indicated above     Objective:    BP 102/64 mmHg  Pulse 82  Temp(Src) 98.1 F (36.7 C)  Ht 5\' 9"  (1.753 m)  Wt 166 lb 3.2 oz (75.388 kg)  BMI 24.53 kg/m2  SpO2 98%  Wt Readings from Last 3 Encounters:  05/13/16 166 lb 3.2 oz (75.388 kg)  03/17/16 161 lb 12.8 oz (73.392 kg)  02/22/16 170 lb (77.111 kg)    Physical Exam  Constitutional: He is oriented to person, place, and time. He appears well-developed and well-nourished.  HENT:  Head: Normocephalic and atraumatic.  Mouth/Throat: Oral lesions (aphthous ulcer lower front area) present.  Neck: Neck supple.  Cardiovascular: Normal rate and regular rhythm.   Pulmonary/Chest: Effort normal and breath sounds normal. He has no wheezes.  Abdominal: Soft. Bowel sounds are normal. There is no hepatosplenomegaly. There is no tenderness.  Musculoskeletal: He exhibits no edema.  Lymphadenopathy:    He has no cervical adenopathy.  Neurological: He is alert and oriented to person, place, and time.  Skin: Skin is warm and dry.  Healing wound R thigh- possibly from insect bite. No redness, swelling or purulence.  Area is about 3 mm diameter  Psychiatric: He has a normal mood and  affect. His behavior is normal.  Vitals reviewed.   Results for orders placed or performed in visit on 05/10/16  HgB A1c  Result Value Ref Range   Hgb A1c MFr Bld 5.0 <5.7 %   Mean Plasma Glucose 97 mg/dL      Assessment & Plan:   Encounter Diagnoses  Name Primary?  . Essential hypertension, benign Yes  . Cigarette nicotine dependence without complication     -reviewed  labs with pt.  He came to this office stating he was diabetic and was on multiple agents.  These have been weaned off him and now his labs, off all diabetic agents, show that he is  NOT DIABETIC ! -f/u 6 months.  RTO sooner prn

## 2016-05-20 ENCOUNTER — Ambulatory Visit: Payer: Self-pay | Admitting: Physician Assistant

## 2016-08-16 ENCOUNTER — Encounter (HOSPITAL_COMMUNITY): Payer: Self-pay

## 2016-08-16 ENCOUNTER — Emergency Department (HOSPITAL_COMMUNITY)
Admission: EM | Admit: 2016-08-16 | Discharge: 2016-08-16 | Disposition: A | Discharge status: Home / Self Care | Payer: Self-pay | Attending: Emergency Medicine | Admitting: Emergency Medicine

## 2016-08-16 DIAGNOSIS — I10 Essential (primary) hypertension: Secondary | ICD-10-CM | POA: Insufficient documentation

## 2016-08-16 DIAGNOSIS — Z7982 Long term (current) use of aspirin: Secondary | ICD-10-CM | POA: Insufficient documentation

## 2016-08-16 DIAGNOSIS — F17210 Nicotine dependence, cigarettes, uncomplicated: Secondary | ICD-10-CM | POA: Insufficient documentation

## 2016-08-16 DIAGNOSIS — F419 Anxiety disorder, unspecified: Secondary | ICD-10-CM | POA: Insufficient documentation

## 2016-08-16 DIAGNOSIS — F1210 Cannabis abuse, uncomplicated: Secondary | ICD-10-CM | POA: Insufficient documentation

## 2016-08-16 DIAGNOSIS — Z79899 Other long term (current) drug therapy: Secondary | ICD-10-CM | POA: Insufficient documentation

## 2016-08-16 LAB — COMPREHENSIVE METABOLIC PANEL
ALT: 15 U/L — AB (ref 17–63)
AST: 17 U/L (ref 15–41)
Albumin: 4.2 g/dL (ref 3.5–5.0)
Alkaline Phosphatase: 52 U/L (ref 38–126)
Anion gap: 5 (ref 5–15)
BUN: 10 mg/dL (ref 6–20)
CHLORIDE: 105 mmol/L (ref 101–111)
CO2: 28 mmol/L (ref 22–32)
CREATININE: 0.84 mg/dL (ref 0.61–1.24)
Calcium: 8.9 mg/dL (ref 8.9–10.3)
GFR calc Af Amer: >60 mL/min (ref >60)
GLUCOSE: 107 mg/dL — AB (ref 65–99)
POTASSIUM: 3.6 mmol/L (ref 3.5–5.1)
Sodium: 138 mmol/L (ref 135–145)
Total Bilirubin: 0.9 mg/dL (ref 0.3–1.2)
Total Protein: 6.9 g/dL (ref 6.5–8.1)

## 2016-08-16 LAB — URINALYSIS, ROUTINE W REFLEX MICROSCOPIC
Bilirubin Urine: NEGATIVE
Glucose, UA: NEGATIVE mg/dL
HGB URINE DIPSTICK: NEGATIVE
KETONES UR: NEGATIVE mg/dL
Leukocytes, UA: NEGATIVE
NITRITE: NEGATIVE
PROTEIN: NEGATIVE mg/dL
Specific Gravity, Urine: 1.015 (ref 1.005–1.030)
pH: 5.5 (ref 5.0–8.0)

## 2016-08-16 LAB — RAPID URINE DRUG SCREEN, HOSP PERFORMED
AMPHETAMINES: NOT DETECTED
BARBITURATES: NOT DETECTED
BENZODIAZEPINES: NOT DETECTED
Cocaine: NOT DETECTED
Opiates: NOT DETECTED
TETRAHYDROCANNABINOL: POSITIVE — AB

## 2016-08-16 LAB — CBC WITH DIFFERENTIAL/PLATELET
BASOS ABS: 0.1 10*3/uL (ref 0.0–0.1)
Basophils Relative: 1 %
Eosinophils Absolute: 0.2 10*3/uL (ref 0.0–0.7)
Eosinophils Relative: 2 %
HEMATOCRIT: 40.6 % (ref 39.0–52.0)
Hemoglobin: 14.2 g/dL (ref 13.0–17.0)
LYMPHS ABS: 2 10*3/uL (ref 0.7–4.0)
LYMPHS PCT: 18 %
MCH: 32.1 pg (ref 26.0–34.0)
MCHC: 35 g/dL (ref 30.0–36.0)
MCV: 91.9 fL (ref 78.0–100.0)
MONO ABS: 1.1 10*3/uL — AB (ref 0.1–1.0)
MONOS PCT: 10 %
NEUTROS ABS: 7.8 10*3/uL — AB (ref 1.7–7.7)
Neutrophils Relative %: 69 %
Platelets: 138 10*3/uL — ABNORMAL LOW (ref 150–400)
RBC: 4.42 MIL/uL (ref 4.22–5.81)
RDW: 12.5 % (ref 11.5–15.5)
WBC: 11.1 10*3/uL — ABNORMAL HIGH (ref 4.0–10.5)

## 2016-08-16 LAB — CBG MONITORING, ED: GLUCOSE-CAPILLARY: 99 mg/dL (ref 65–99)

## 2016-08-16 LAB — TROPONIN I

## 2016-08-16 NOTE — ED Notes (Signed)
MD at bedside. Dr. Rancour 

## 2016-08-16 NOTE — ED Provider Notes (Signed)
[facility]-EMERGENCY DEPT Provider Note   CSN: [id number] Arrival date & time: 08/16/16  0546     History   Chief Complaint Chief Complaint  Patient presents with  . Anxiety    HPI Jeffery Robertson is a 40 y.o. male.  Patient presents with "shaking in my legs" with numbness and tingling in his bilateral feet that onset while he was sitting in bed playing video games. He states this happened about one hour ago and is slowly improving. Also has some shaking and tingling in his hands to a less extent. States he has been up all night, as he slept until 1 PM yesterday and was about to go to bed. Denies any alcohol or drug use. States he used to be a heavy drinker but has been clean for several years. Last drink was 3 days ago. States he does not drink more than 1 or 2 at a time. Denies any drug use. Denies any prescribed medication use.  Denies headache or dizziness.   Denies any chest pain or shortness of breath. Denies any focal weakness. Denies difficulty speaking or swallowing. Denies any excessive caffeine use but has been drinking tea.   The history is provided by the patient.  Anxiety  Pertinent negatives include no chest pain, no abdominal pain, no headaches and no shortness of breath.    Past Medical History:  Diagnosis Date  . Foot ulcer, left (HCC)    secondary to DM  . Hypertension   . IBS (irritable bowel syndrome)     Patient Active Problem List   Diagnosis Date Noted  . Cigarette nicotine dependence without complication 02/17/2016  . Anxiety disorder 02/17/2016  . Nonhealing nonsurgical wound 08/22/2013    Past Surgical History:  Procedure Laterality Date  . APPENDECTOMY         Home Medications    Prior to Admission medications   Medication Sig Start Date End Date Taking? Authorizing Provider  aspirin EC 81 MG tablet Take 81 mg by mouth every 6 (six) hours as needed for mild pain.    Historical Provider, MD  lisinopril (PRINIVIL,ZESTRIL) 10 MG tablet TAKE  ONE TABLET BY MOUTH ONCE DAILY 04/20/16   Jacquelin Hawking, PA-C  vitamin B-12 (CYANOCOBALAMIN) 1000 MCG tablet Take 1,000 mcg by mouth daily.    Historical Provider, MD    Family History Family History  Problem Relation Age of Onset  . Cancer Mother     breast  . Heart disease Mother     Social History Social History  Substance Use Topics  . Smoking status: Current Every Day Smoker    Packs/day: 1.00    Years: 14.00    Types: Cigarettes  . Smokeless tobacco: Never Used  . Alcohol use Yes     Comment: occasional     Allergies   Pollen extract   Review of Systems Review of Systems  Constitutional: Positive for fatigue. Negative for activity change, appetite change and fever.  HENT: Negative for congestion and rhinorrhea.   Respiratory: Negative for cough, chest tightness and shortness of breath.   Cardiovascular: Negative for chest pain.  Gastrointestinal: Negative for abdominal pain, nausea and vomiting.  Genitourinary: Negative for dysuria, hematuria and testicular pain.  Musculoskeletal: Negative for arthralgias, back pain and myalgias.  Skin: Negative for wound.  Neurological: Positive for numbness. Negative for dizziness, facial asymmetry, speech difficulty, light-headedness and headaches.  A complete 10 system review of systems was obtained and all systems are negative except as noted in the  HPI and PMH.     Physical Exam Updated Vital Signs BP 144/97   Pulse 94   Temp 98 F (36.7 C) (Oral)   Resp 18   Ht 5\' 11"  (1.803 m)   Wt 174 lb (78.9 kg)   SpO2 100%   BMI 24.27 kg/m   Physical Exam  Constitutional: He is oriented to person, place, and time. He appears well-developed and well-nourished. No distress.  HENT:  Head: Normocephalic and atraumatic.  Mouth/Throat: Oropharynx is clear and moist. No oropharyngeal exudate.  Eyes: Conjunctivae and EOM are normal. Pupils are equal, round, and reactive to light.  Neck: Normal range of motion. Neck supple.  No  meningismus.  Cardiovascular: Normal rate, regular rhythm, normal heart sounds and intact distal pulses.   No murmur heard. Pulmonary/Chest: Effort normal and breath sounds normal. No respiratory distress.  Abdominal: Soft. There is no tenderness. There is no rebound and no guarding.  Musculoskeletal: Normal range of motion. He exhibits no edema or tenderness.  Intact DP and PT pulses  Neurological: He is alert and oriented to person, place, and time. No cranial nerve deficit. He exhibits normal muscle tone. Coordination normal.  No ataxia on finger to nose bilaterally. No pronator drift. 5/5 strength throughout. CN 2-12 intact.Equal grip strength. Sensation intact.   No tremors, no asterixis  Skin: Skin is warm.  Psychiatric: He has a normal mood and affect. His behavior is normal.  Nursing note and vitals reviewed.    ED Treatments / Results  Labs (all labs ordered are listed, but only abnormal results are displayed) Labs Reviewed  CBC WITH DIFFERENTIAL/PLATELET - Abnormal; Notable for the following:       Result Value   WBC 11.1 (*)    Platelets 138 (*)    Neutro Abs 7.8 (*)    Monocytes Absolute 1.1 (*)    All other components within normal limits  COMPREHENSIVE METABOLIC PANEL - Abnormal; Notable for the following:    Glucose, Bld 107 (*)    ALT 15 (*)    All other components within normal limits  URINE RAPID DRUG SCREEN, HOSP PERFORMED - Abnormal; Notable for the following:    Tetrahydrocannabinol POSITIVE (*)    All other components within normal limits  TROPONIN I  URINALYSIS, ROUTINE W REFLEX MICROSCOPIC (NOT AT [facility])  CBG MONITORING, ED    EKG  EKG Interpretation  Date/Time:  Monday August 16 2016 06:19:27 EDT Ventricular Rate:  87 PR Interval:    QRS Duration: 97 QT Interval:  368 QTC Calculation: 443 R Axis:   75 Text Interpretation:  Sinus rhythm No significant change was found Confirmed by Manus Gunning  MD, Jahir Halt ([id number]) on 08/16/2016 6:43:52 AM        Radiology No results found.  Procedures Procedures (including critical care time)  Medications Ordered in ED Medications - No data to display   Initial Impression / Assessment and Plan / ED Course  I have reviewed the triage vital signs and the nursing notes.  Pertinent labs & imaging results that were available during my care of the patient were reviewed by me and considered in my medical decision making (see chart for details).  Clinical Course  Paresthesias in bilateral feet and legs with feeling of anxiety, improving.  No CP or SOB.  No focal neuro deficits.  No tremors.  No evidence of life threatening ethanol withdrawal.  Drug screen positive for marijuana only. Intact distal pulses.  Labs reassuring.  Stable thrombocytopenia. Tingling and "shaking"  has improved.  States may have anxiety but has never been treated. Denies SI or HI.  D/w patient may be anxiety related.  Stop marijuana use. Followup with PCP. Return precautions discussed.  Final Clinical Impressions(s) / ED Diagnoses   Final diagnoses:  Anxiety  Marijuana abuse    New Prescriptions New Prescriptions   No medications on file     Glynn OctaveStephen Khaliah Barnick, MD 08/16/16 [phone]

## 2016-08-16 NOTE — Discharge Instructions (Signed)
Establish care with a primary physician. Stop using marijuana, decrease your caffeine intake. Return to the ED if you develop chest pain, shortness of breath, or any other concerns.

## 2016-08-16 NOTE — ED Triage Notes (Signed)
Pt reports lying in bed, watching television when he started to feel shaky, anxiety, like his heart was beating fast, and tightness to left arm and back. Pt reports he has not been to sleep all night as he slept until 1:00pm yesterday.

## 2016-11-04 ENCOUNTER — Ambulatory Visit: Payer: Self-pay | Admitting: Physician Assistant

## 2016-11-18 IMAGING — CT CT RENAL STONE PROTOCOL
3 of 4 series · 8 of 46 positions shown, 15 images · non-contrast
Comparison: None.

CLINICAL DATA: Patient with bilateral flank pain and hematuria for
4 days.

EXAM:
CT ABDOMEN AND PELVIS WITHOUT CONTRAST
TECHNIQUE: Multidetector CT imaging of the abdomen and pelvis was performed
following the standard protocol without IV contrast.

[Series 3: lung 5.0 b60f · axial · 0.73mm/px · z∈[-76,-11]mm · 4 of 23 slices shown, 9 images]
[im 5/23  soft-tissue]
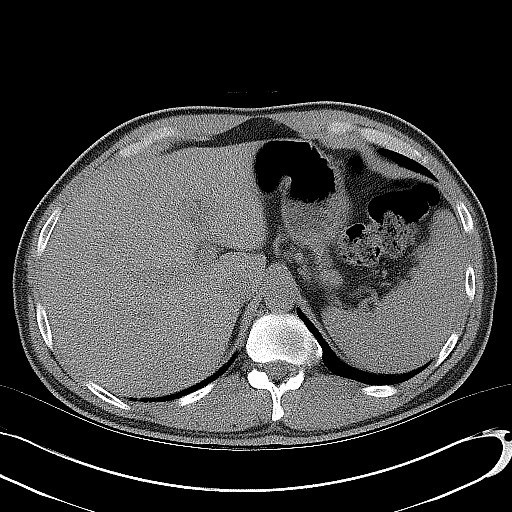
[im 5/23  lung]
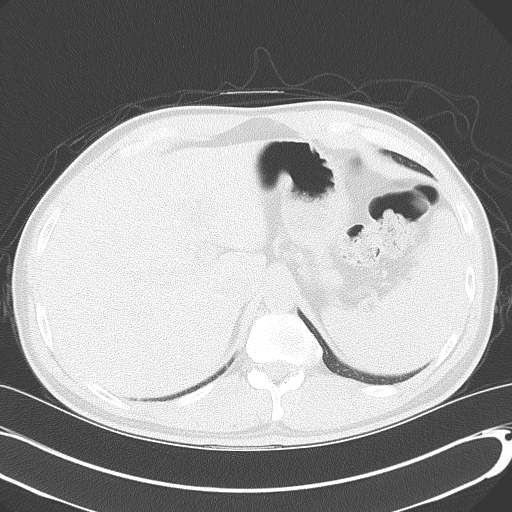
[im 5/23  bone]
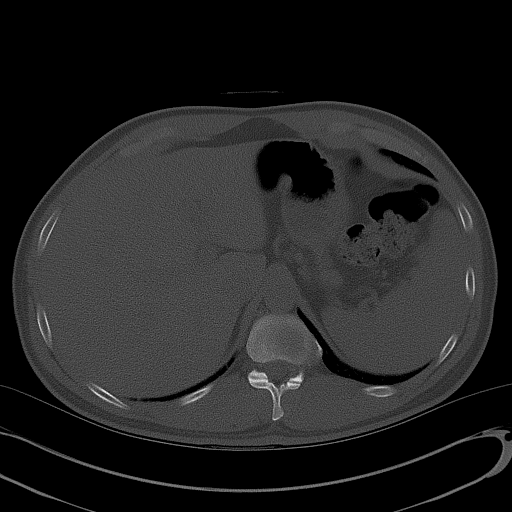
[im 9/23  soft-tissue]
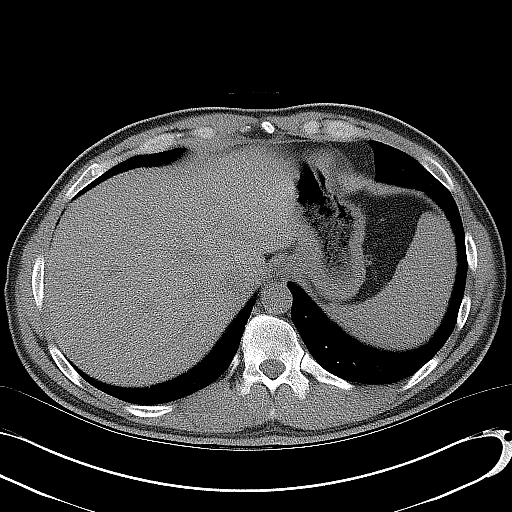
[im 9/23  lung]
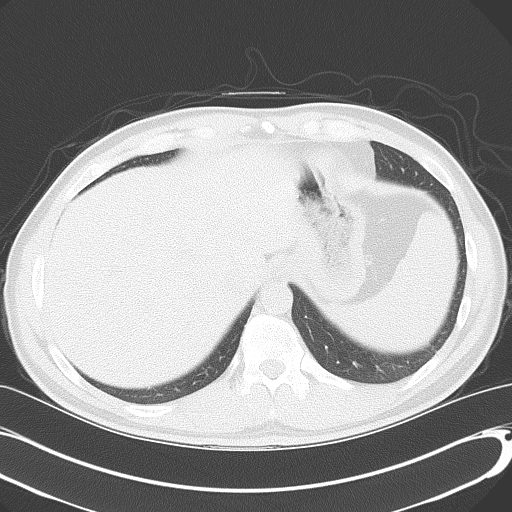
[im 14/23  soft-tissue]
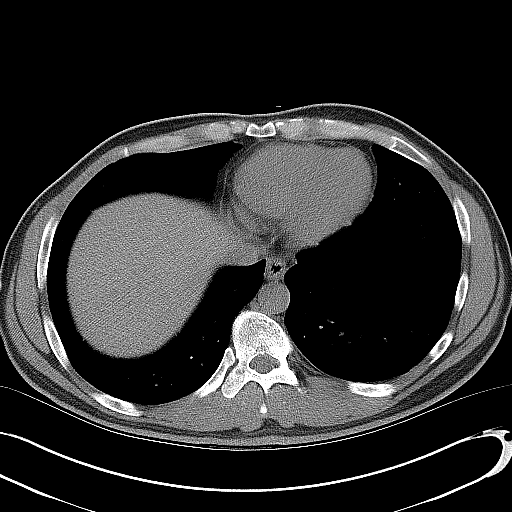
[im 14/23  lung]
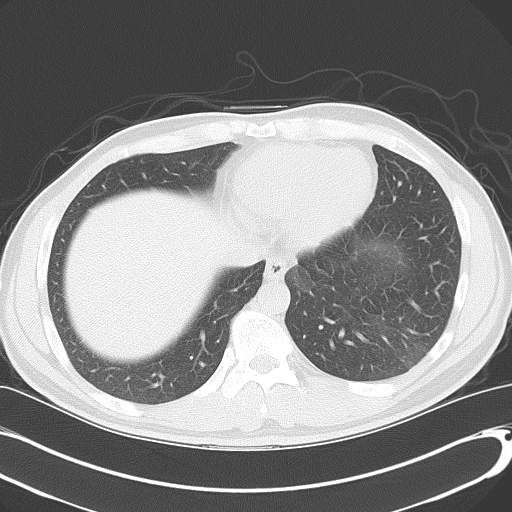
[im 18/23  soft-tissue]
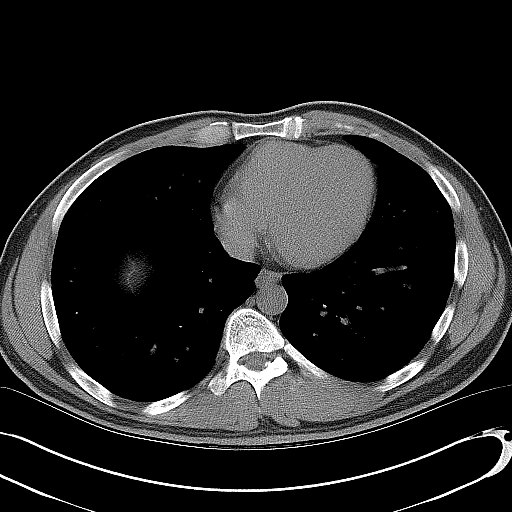
[im 18/23  lung]
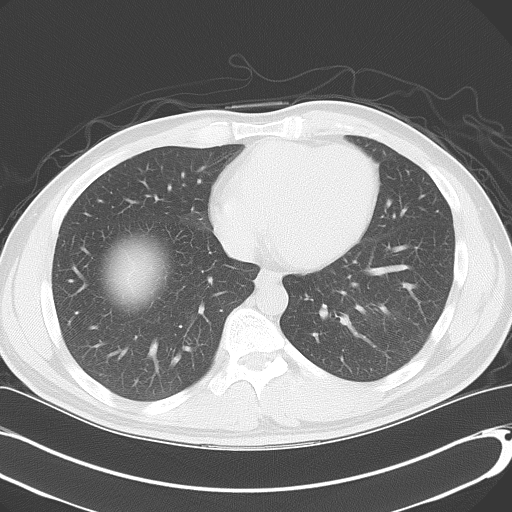

[Series 4: mpr coronal 3.0mm · coronal · 0.72mm/px · 3 of 79 slices shown, 4 images]
[im 27/79  soft-tissue]
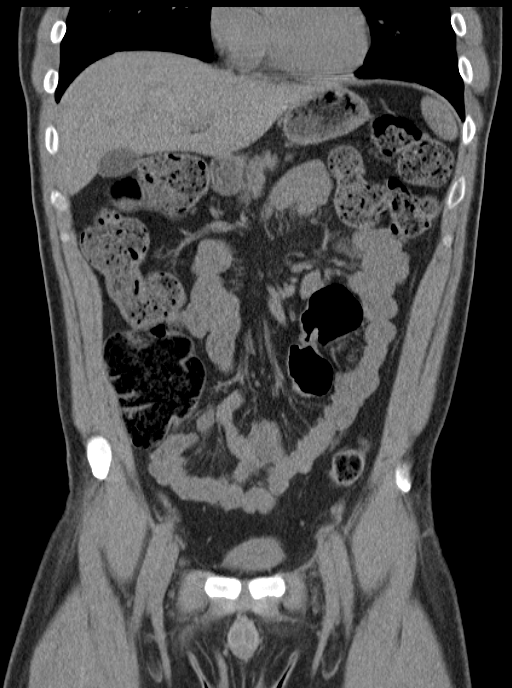
[im 35/79  soft-tissue]
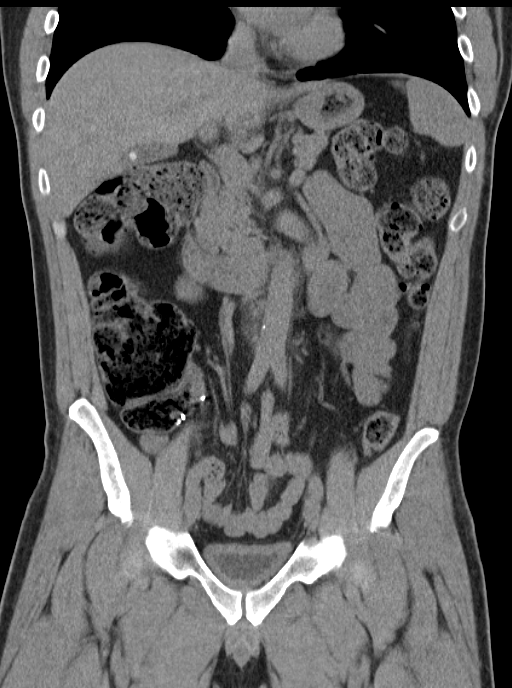
[im 35/79  bone]
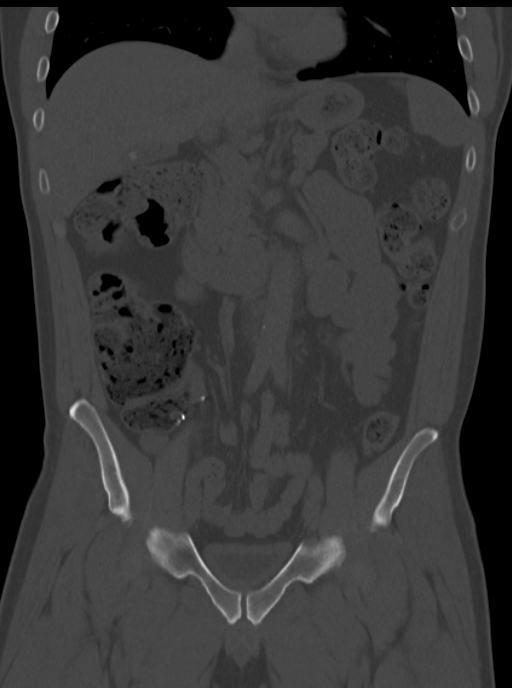
[im 44/79  soft-tissue]
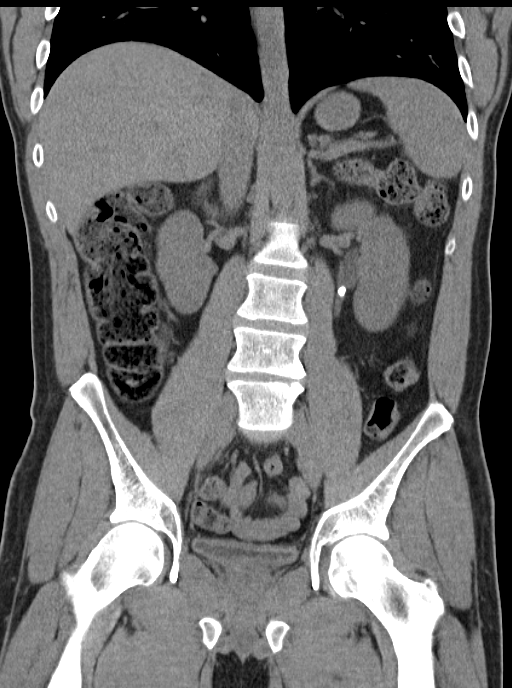

[Series 5: mpr sagittal 3.0mm · sagittal · 0.55mm/px · 1 of 111 slices shown, 2 images]
[im 37/111  soft-tissue]
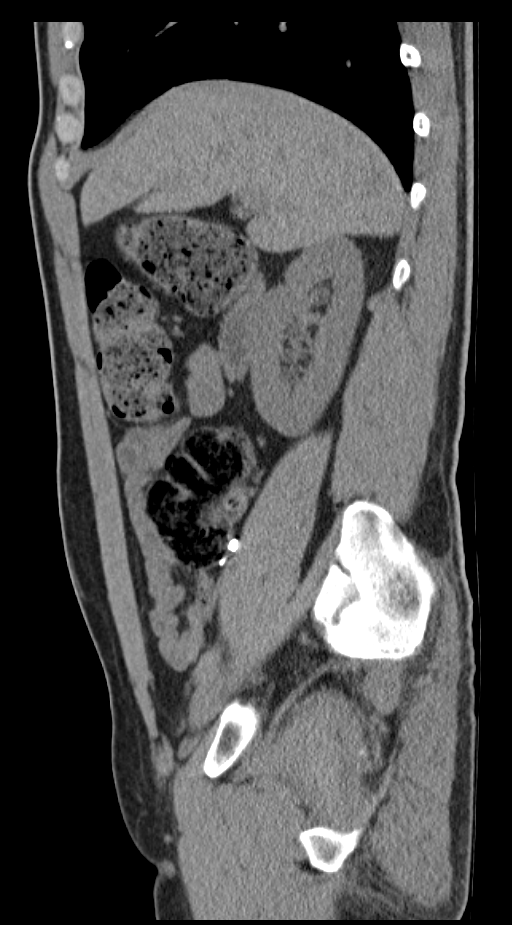
[im 37/111  bone]
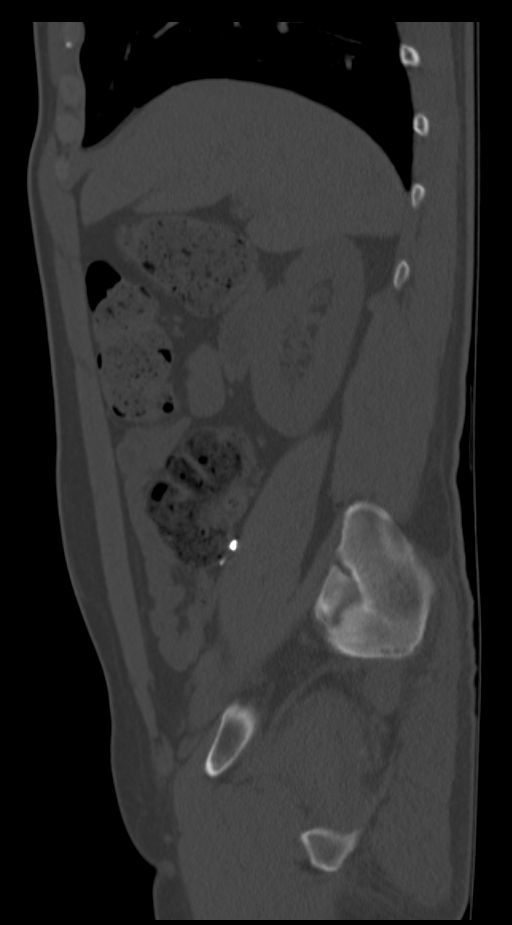

[8 of 46 positions shown; findings below may reference images not displayed]

FINDINGS: Lower chest: Lung bases show no acute findings. Heart size normal.
No pericardial or pleural effusion.

Hepatobiliary: Liver is unremarkable. Small stone in the
gallbladder. No biliary ductal dilatation.

Pancreas: Negative.

Spleen: Negative.

Adrenals/Urinary Tract: Adrenal glands are unremarkable. There may
be a punctate stone in the right kidney. Right ureter is
decompressed. Mild left hydronephrosis secondary to a 5 mm stone at
the left ureteral pelvic junction. Left ureter is otherwise
decompressed. Bladder is somewhat low in volume which may account
for bladder wall thickening.

Stomach/Bowel: Stomach and small bowel are unremarkable. Appendix
appears surgically absent. Stool is seen in the majority of the
colon.

Vascular/Lymphatic: Minimal atherosclerotic calcification of the
arterial vasculature. Scattered lymph nodes are not enlarged by CT
size criteria.

Reproductive: Prostate is normal in size.

Other: No free fluid.  Mesenteries and peritoneum are unremarkable.

Musculoskeletal: No worrisome lytic or sclerotic lesions.
IMPRESSION: 1. Mild left hydronephrosis secondary to a 5 mm stone at the left
ureteral pelvic junction.
2. Punctate right renal stone, nonobstructing.
3. Cholelithiasis.
4. Stool in the majority of the colon is indicative of constipation.

## 2017-11-26 ENCOUNTER — Encounter (HOSPITAL_COMMUNITY): Payer: Self-pay | Admitting: Emergency Medicine

## 2017-11-26 ENCOUNTER — Other Ambulatory Visit: Payer: Self-pay

## 2017-11-26 ENCOUNTER — Emergency Department (HOSPITAL_COMMUNITY)
Admission: EM | Admit: 2017-11-26 | Discharge: 2017-11-26 | Disposition: A | Discharge status: Home / Self Care | Payer: Self-pay | Attending: Emergency Medicine | Admitting: Emergency Medicine

## 2017-11-26 ENCOUNTER — Emergency Department (HOSPITAL_COMMUNITY): Payer: Self-pay

## 2017-11-26 DIAGNOSIS — I10 Essential (primary) hypertension: Secondary | ICD-10-CM | POA: Insufficient documentation

## 2017-11-26 DIAGNOSIS — S62346A Nondisplaced fracture of base of fifth metacarpal bone, right hand, initial encounter for closed fracture: Secondary | ICD-10-CM | POA: Insufficient documentation

## 2017-11-26 DIAGNOSIS — Y9301 Activity, walking, marching and hiking: Secondary | ICD-10-CM | POA: Insufficient documentation

## 2017-11-26 DIAGNOSIS — Y998 Other external cause status: Secondary | ICD-10-CM | POA: Insufficient documentation

## 2017-11-26 DIAGNOSIS — F17210 Nicotine dependence, cigarettes, uncomplicated: Secondary | ICD-10-CM | POA: Insufficient documentation

## 2017-11-26 DIAGNOSIS — Y929 Unspecified place or not applicable: Secondary | ICD-10-CM | POA: Insufficient documentation

## 2017-11-26 DIAGNOSIS — W0110XA Fall on same level from slipping, tripping and stumbling with subsequent striking against unspecified object, initial encounter: Secondary | ICD-10-CM | POA: Insufficient documentation

## 2017-11-26 MED ORDER — TRAMADOL HCL 50 MG PO TABS: 50.0000 mg | ORAL_TABLET | Freq: Four times a day (QID) | ORAL | 0 refills | Status: AC | PRN

## 2017-11-26 MED ORDER — HYDROCODONE-ACETAMINOPHEN 5-325 MG PO TABS
1.0000 | ORAL_TABLET | Freq: Once | ORAL | Status: AC
  Administered 2017-11-26: 1 via ORAL
  Filled 2017-11-26: qty 1

## 2017-11-26 NOTE — Discharge Instructions (Signed)
You were seen here today for hand pain.  Your x-rays show a fracture to the base of her fifth metacarpal.  I have attached handouts on this.  Please follow-up with hand specialist for further evaluation.  I provided a referral on this handout.  For pain control you may take: 800mg  of ibuprofen (that is usually four 200mg  over the counter pills) up to 3 times a day (please take with food) and acetaminophen 975mg  (this is 3 normal strength, 325mg , over the counter pills) up to four times a day. Please do not take more than this. Do not drink alcohol or combine with other medications that have acetaminophen or Ibuprofen as an ingredient (Read the labels!).    For breakthrough pain you may take Ultram. Do not drink alcohol drive or operate heavy machinery when taking. You are being provided a prescription for opiates (also known as narcotics) for pain control on an ?as needed? basis.  Opiates can be addictive and should only be used when absolutely necessary for pain control when other alternatives do not work.  We recommend you only use them for the recommended amount of time and only as prescribed.  Please do not take with other sedative medications or alcohol.  Please do not drive, operate machinery, or make important decisions while taking opiates.  Please note that these medications can be addictive and have high abuse potential.  Please keep these medications locked away from children, teenagers or any family members with history of substance abuse. Additionally, these medications may cause constipation - take over the counter stool softeners or add fiber to your diet to treat this (Metamucil, Psyllium Fiber, Colace, Miralax) Further refills will need to be obtained from your primary care doctor and will not be prescribed through the Emergency Department. You will test positive on most drug tests while taking this medication.

## 2017-11-26 NOTE — ED Provider Notes (Signed)
[facility] EMERGENCY DEPARTMENT Provider Note   CSN: [id number] Arrival date & time: 11/26/17  1316     History   Chief Complaint Chief Complaint  Patient presents with  . Hand Pain    HPI Jeffery Robertson is a 42 y.o. right-handed male who presents the emergency department today for right hand pain.  Patient states that 2 nights ago he was walking in the dark when he tripped over something and fell onto the dorsal aspect of his right hand with all of his weight.  Since then he has had pain and swelling over the ulnar aspect of the hand as well as the 5th MCP. The patient has full ROM of all fingers but notes it is painful.  He has been taking BC powder for this with mild relief.  He denies punching injury, any open wounds, numbness or tingling.  HPI  Past Medical History:  Diagnosis Date  . Foot ulcer, left (HCC)    secondary to DM  . Hypertension   . IBS (irritable bowel syndrome)     Patient Active Problem List   Diagnosis Date Noted  . Cigarette nicotine dependence without complication 02/17/2016  . Anxiety disorder 02/17/2016  . Nonhealing nonsurgical wound 08/22/2013    Past Surgical History:  Procedure Laterality Date  . APPENDECTOMY         Home Medications    Prior to Admission medications   Medication Sig Start Date End Date Taking? Authorizing Provider  aspirin EC 81 MG tablet Take 81 mg by mouth every 6 (six) hours as needed for mild pain.    [provider]  lisinopril (PRINIVIL,ZESTRIL) 10 MG tablet TAKE ONE TABLET BY MOUTH ONCE DAILY 04/20/16   Jacquelin Hawking, PA-C  vitamin B-12 (CYANOCOBALAMIN) 1000 MCG tablet Take 1,000 mcg by mouth daily.    [provider]    Family History Family History  Problem Relation Age of Onset  . Cancer Mother        breast  . Heart disease Mother     Social History Social History   Tobacco Use  . Smoking status: Current Every Day Smoker    Packs/day: 1.00    Years: 14.00    Pack years:  14.00    Types: Cigarettes  . Smokeless tobacco: Never Used  Substance Use Topics  . Alcohol use: Yes    Comment: occasional  . Drug use: Yes    Types: Marijuana    Comment: occassional last date 11/17/17     Allergies   Pollen extract   Review of Systems Review of Systems  Musculoskeletal: Positive for arthralgias and joint swelling.  Skin: Negative for wound.  Neurological: Negative for numbness.     Physical Exam Updated Vital Signs BP (!) 162/90 (BP Location: Left Arm)   Pulse 84   Temp 98 F (36.7 C) (Oral)   Resp 18   Ht 5\' 10"  (1.778 m)   Wt 80.7 kg (178 lb)   SpO2 100%   BMI 25.54 kg/m   Physical Exam  Constitutional: He appears well-developed and well-nourished.  HENT:  Head: Normocephalic and atraumatic.  Right Ear: External ear normal.  Left Ear: External ear normal.  Eyes: Conjunctivae are normal. Right eye exhibits no discharge. Left eye exhibits no discharge. No scleral icterus.  Pulmonary/Chest: Effort normal. No respiratory distress.  Musculoskeletal:  Right hand: There is swelling along the ulnar aspect of the hand.  Skin intact.  No redness or warmth.  Fingers appear normal.  There is tenderness palpation along the fifth metacarpal joint as well as the fifth MCP. Fingers appear normal. No TTP over flexor sheath. No snuffbox TTP. Finger adduction/abduction intact with 5/5 strength.   Full active and resisted ROM to flexion/extension at wrist, MCP, PIP and DIP of all fingers.  FDS/FDP intact.  No scissoring of fingers on exam.  Radial artery 2+ with <2sec cap refill. SILT in M/U/R distributions. Grip 5/5 strength.    Neurological: He is alert.  Skin: No pallor.  Psychiatric: He has a normal mood and affect.  Nursing note and vitals reviewed.    ED Treatments / Results  Labs (all labs ordered are listed, but only abnormal results are displayed) Labs Reviewed - No data to display  EKG  EKG Interpretation None       Radiology Dg Hand  Complete Right  Result Date: 11/26/2017 CLINICAL DATA:  Status post fall 2 nights ago with right hand pain. EXAM: RIGHT HAND - COMPLETE 3+ VIEW COMPARISON:  None. FINDINGS: There is displaced intra-articular fracture at the base of the fifth metacarpal with surrounding soft tissue swelling. There is no dislocation. IMPRESSION: Fracture at the base of the fifth metacarpal. Electronically Signed   By: Sherian ReinWei-Chen  Lin M.D.   On: 11/26/2017 14:16    Procedures Procedures (including critical care time) SPLINT APPLICATION Date/Time: 3:16 PM Authorized by: Jacinto HalimMichael M Charlina Dwight Consent: Verbal consent obtained. Risks and benefits: risks, benefits and alternatives were discussed Consent given by: patient Splint applied by: orthopedic technician Location details: Right hand Splint type: ulnar gutter Supplies used: ulnar gutter splint Post-procedure: The splinted body part was neurovascularly unchanged following the procedure. Patient tolerance: Patient tolerated the procedure well with no immediate complications.    Medications Ordered in ED Medications  HYDROcodone-acetaminophen (NORCO/VICODIN) 5-325 MG per tablet 1 tablet (not administered)     Initial Impression / Assessment and Plan / ED Course  I have reviewed the triage vital signs and the nursing notes.  Pertinent labs & imaging results that were available during my care of the patient were reviewed by me and considered in my medical decision making (see chart for details).     Patient here with right hand pain. Patient X-Ray with fracture to the base of fifth metacarpal.  This is a closed fracture.  Pain managed in ED. Pt advised to follow up with hand specialist for further evaluation and treatment.   Ulnar gutter splint placed in the emergency department.  While in ED, conservative therapy recommended and discussed. Patient will be dc home & is agreeable with above plan. I have also discussed reasons to return immediately to the ER.   Patient expresses understanding and agrees with plan.  Final Clinical Impressions(s) / ED Diagnoses   Final diagnoses:  Closed nondisplaced fracture of base of fifth metacarpal bone of right hand, initial encounter    ED Discharge Orders        Ordered    traMADol (ULTRAM) 50 MG tablet  Every 6 hours PRN     11/26/17 1516       Princella PellegriniMaczis, Jalexis Breed M, PA-C 11/26/17 1518    Mancel BaleWentz, Elliott, MD 11/27/17 1219

## 2017-11-26 NOTE — ED Triage Notes (Signed)
Pt states he fell two nights ago and attempted to catch himself, now has R hand pain and swelling. Able to move all fingers, but states pain is near pinky. Has taken a BC powder today.

## 2018-03-14 ENCOUNTER — Other Ambulatory Visit: Payer: Self-pay

## 2018-03-14 ENCOUNTER — Encounter (HOSPITAL_COMMUNITY): Payer: Self-pay | Admitting: Emergency Medicine

## 2018-03-14 ENCOUNTER — Emergency Department (HOSPITAL_COMMUNITY)
Admission: EM | Admit: 2018-03-14 | Discharge: 2018-03-14 | Disposition: A | Discharge status: Home / Self Care | Payer: Self-pay | Attending: Emergency Medicine | Admitting: Emergency Medicine

## 2018-03-14 DIAGNOSIS — Y9389 Activity, other specified: Secondary | ICD-10-CM | POA: Insufficient documentation

## 2018-03-14 DIAGNOSIS — Y929 Unspecified place or not applicable: Secondary | ICD-10-CM | POA: Insufficient documentation

## 2018-03-14 DIAGNOSIS — F17210 Nicotine dependence, cigarettes, uncomplicated: Secondary | ICD-10-CM | POA: Insufficient documentation

## 2018-03-14 DIAGNOSIS — R197 Diarrhea, unspecified: Secondary | ICD-10-CM | POA: Insufficient documentation

## 2018-03-14 DIAGNOSIS — Z79899 Other long term (current) drug therapy: Secondary | ICD-10-CM | POA: Insufficient documentation

## 2018-03-14 DIAGNOSIS — I10 Essential (primary) hypertension: Secondary | ICD-10-CM | POA: Insufficient documentation

## 2018-03-14 DIAGNOSIS — R112 Nausea with vomiting, unspecified: Secondary | ICD-10-CM | POA: Insufficient documentation

## 2018-03-14 DIAGNOSIS — Y998 Other external cause status: Secondary | ICD-10-CM | POA: Insufficient documentation

## 2018-03-14 DIAGNOSIS — F1210 Cannabis abuse, uncomplicated: Secondary | ICD-10-CM | POA: Insufficient documentation

## 2018-03-14 DIAGNOSIS — W57XXXA Bitten or stung by nonvenomous insect and other nonvenomous arthropods, initial encounter: Secondary | ICD-10-CM | POA: Insufficient documentation

## 2018-03-14 DIAGNOSIS — S70369A Insect bite (nonvenomous), unspecified thigh, initial encounter: Secondary | ICD-10-CM | POA: Insufficient documentation

## 2018-03-14 LAB — URINALYSIS, ROUTINE W REFLEX MICROSCOPIC
Bacteria, UA: NONE SEEN
GLUCOSE, UA: NEGATIVE mg/dL
Hgb urine dipstick: NEGATIVE
Ketones, ur: 5 mg/dL — AB
Leukocytes, UA: NEGATIVE
NITRITE: NEGATIVE
PH: 5 (ref 5.0–8.0)
Protein, ur: 30 mg/dL — AB
SPECIFIC GRAVITY, URINE: 1.032 — AB (ref 1.005–1.030)

## 2018-03-14 LAB — CBC WITH DIFFERENTIAL/PLATELET
BASOS PCT: 0 %
Basophils Absolute: 0 10*3/uL (ref 0.0–0.1)
EOS ABS: 0.1 10*3/uL (ref 0.0–0.7)
EOS PCT: 1 %
HCT: 51.5 % (ref 39.0–52.0)
HEMOGLOBIN: 17.5 g/dL — AB (ref 13.0–17.0)
Lymphocytes Relative: 2 %
Lymphs Abs: 0.3 10*3/uL — ABNORMAL LOW (ref 0.7–4.0)
MCH: 32.5 pg (ref 26.0–34.0)
MCHC: 34 g/dL (ref 30.0–36.0)
MCV: 95.7 fL (ref 78.0–100.0)
MONOS PCT: 5 %
Monocytes Absolute: 0.6 10*3/uL (ref 0.1–1.0)
NEUTROS PCT: 92 %
Neutro Abs: 11.2 10*3/uL — ABNORMAL HIGH (ref 1.7–7.7)
PLATELETS: 138 10*3/uL — AB (ref 150–400)
RBC: 5.38 MIL/uL (ref 4.22–5.81)
RDW: 12.7 % (ref 11.5–15.5)
WBC: 12.2 10*3/uL — AB (ref 4.0–10.5)

## 2018-03-14 LAB — BASIC METABOLIC PANEL
Anion gap: 13 (ref 5–15)
BUN: 17 mg/dL (ref 6–20)
CALCIUM: 9.5 mg/dL (ref 8.9–10.3)
CHLORIDE: 102 mmol/L (ref 101–111)
CO2: 20 mmol/L — ABNORMAL LOW (ref 22–32)
CREATININE: 1.23 mg/dL (ref 0.61–1.24)
Glucose, Bld: 191 mg/dL — ABNORMAL HIGH (ref 65–99)
Potassium: 4.7 mmol/L (ref 3.5–5.1)
SODIUM: 135 mmol/L (ref 135–145)

## 2018-03-14 MED ORDER — ONDANSETRON 4 MG PO TBDP
4.0000 mg | ORAL_TABLET | Freq: Once | ORAL | Status: AC
  Administered 2018-03-14: 4 mg via ORAL
  Filled 2018-03-14: qty 1

## 2018-03-14 MED ORDER — ONDANSETRON 4 MG PO TBDP: 4.0000 mg | ORAL_TABLET | Freq: Three times a day (TID) | ORAL | 0 refills | Status: AC | PRN

## 2018-03-14 MED ORDER — KETOROLAC TROMETHAMINE 60 MG/2ML IM SOLN
60.0000 mg | Freq: Once | INTRAMUSCULAR | Status: AC
  Administered 2018-03-14: 60 mg via INTRAMUSCULAR
  Filled 2018-03-14: qty 2

## 2018-03-14 MED ORDER — DOXYCYCLINE HYCLATE 100 MG PO CAPS: 100.0000 mg | ORAL_CAPSULE | Freq: Two times a day (BID) | ORAL | 0 refills | Status: AC

## 2018-03-14 NOTE — Discharge Instructions (Signed)
Doxycycline, 100 mg by mouth twice a day for 10 days  Zofran as needed for nausea  Drink plenty of fluids  Seek medical attention for severe or worsening symptoms.  But I would expect you to be sick for 2 to 3 days.

## 2018-03-14 NOTE — ED Provider Notes (Signed)
[facility] EMERGENCY DEPARTMENT Provider Note   CSN: [id number] Arrival date & time: 03/14/18  1312     History   Chief Complaint Chief Complaint  Patient presents with  . Chills    HPI Jeffery Robertson is a 42 y.o. male.  HPI  N/v/d and ha with sob and sweating - he has been thirsty, not keeping down food or water - has some muscular cramping - sx awoke him at 4 this AM - sx are persistent, nothing makes it better, no URI sx, has some seasonal allergies.  Tick bite was approximately 2 weeks ago.  Inner thigh.  Patient has not had objective fevers.  No meds taken PTA  Past Medical History:  Diagnosis Date  . Foot ulcer, left (HCC)    secondary to DM  . Hypertension   . IBS (irritable bowel syndrome)     Patient Active Problem List   Diagnosis Date Noted  . Cigarette nicotine dependence without complication 02/17/2016  . Anxiety disorder 02/17/2016  . Nonhealing nonsurgical wound 08/22/2013    Past Surgical History:  Procedure Laterality Date  . APPENDECTOMY          Home Medications    Prior to Admission medications   Medication Sig Start Date End Date Taking? Authorizing Provider  aspirin EC 81 MG tablet Take 81 mg by mouth every 6 (six) hours as needed for mild pain.    [provider]  doxycycline (VIBRAMYCIN) 100 MG capsule Take 1 capsule (100 mg total) by mouth 2 (two) times daily. 03/14/18   Eber Hong, MD  lisinopril (PRINIVIL,ZESTRIL) 10 MG tablet TAKE ONE TABLET BY MOUTH ONCE DAILY 04/20/16   Jacquelin Hawking, PA-C  ondansetron (ZOFRAN ODT) 4 MG disintegrating tablet Take 1 tablet (4 mg total) by mouth every 8 (eight) hours as needed for nausea. 03/14/18   Eber Hong, MD  traMADol (ULTRAM) 50 MG tablet Take 1 tablet (50 mg total) by mouth every 6 (six) hours as needed. 11/26/17   Maczis, Elmer Sow, PA-C  vitamin B-12 (CYANOCOBALAMIN) 1000 MCG tablet Take 1,000 mcg by mouth daily.    [provider]    Family History Family History   Problem Relation Age of Onset  . Cancer Mother        breast  . Heart disease Mother     Social History Social History   Tobacco Use  . Smoking status: Current Every Day Smoker    Packs/day: 0.50    Years: 14.00    Pack years: 7.00    Types: Cigarettes  . Smokeless tobacco: Never Used  Substance Use Topics  . Alcohol use: Yes    Comment: occasional  . Drug use: Yes    Types: Marijuana    Comment: last use 1 week ago      Allergies   Pollen extract   Review of Systems Review of Systems  All other systems reviewed and are negative.    Physical Exam Updated Vital Signs BP 116/76 (BP Location: Right Arm)   Pulse 85   Temp 98.2 F (36.8 C) (Oral)   Resp 16   Ht  (1.803 m)   Wt 78 kg (172 lb)   SpO2 97%   BMI 23.99 kg/m   Physical Exam  Constitutional: He appears well-developed and well-nourished. No distress.  HENT:  Head: Normocephalic and atraumatic.  Mouth/Throat: Oropharynx is clear and moist. No oropharyngeal exudate.  Eyes: Pupils are equal, round, and reactive to light. Conjunctivae and EOM  are normal. Right eye exhibits no discharge. Left eye exhibits no discharge. No scleral icterus.  Neck: Normal range of motion. Neck supple. No JVD present. No thyromegaly present.  Cardiovascular: Regular rhythm, normal heart sounds and intact distal pulses. Exam reveals no gallop and no friction rub.  No murmur heard. Mild tachcyardia  Pulmonary/Chest: Effort normal and breath sounds normal. No respiratory distress. He has no wheezes. He has no rales.  Abdominal: Soft. Bowel sounds are normal. He exhibits no distension and no mass. There is no tenderness.  Musculoskeletal: Normal range of motion. He exhibits no edema or tenderness.  R medial thigh - 1.5 cm red and central puncture site (mild tenderness).  Lymphadenopathy:    He has no cervical adenopathy.  Neurological: He is alert. Coordination normal.  Skin: Skin is warm and dry. No rash noted. No  erythema.  Rash as above.  Psychiatric: He has a normal mood and affect. His behavior is normal.  Nursing note and vitals reviewed.    ED Treatments / Results  Labs (all labs ordered are listed, but only abnormal results are displayed) Labs Reviewed  CBC WITH DIFFERENTIAL/PLATELET - Abnormal; Notable for the following components:      Result Value   WBC 12.2 (*)    Hemoglobin 17.5 (*)    Platelets 138 (*)    Neutro Abs 11.2 (*)    Lymphs Abs 0.3 (*)    All other components within normal limits  BASIC METABOLIC PANEL - Abnormal; Notable for the following components:   CO2 20 (*)    Glucose, Bld 191 (*)    All other components within normal limits  URINALYSIS, ROUTINE W REFLEX MICROSCOPIC    EKG None  Radiology No results found.  Procedures Procedures (including critical care time)  Medications Ordered in ED Medications  ketorolac (TORADOL) injection 60 mg (has no administration in time range)  ondansetron (ZOFRAN-ODT) disintegrating tablet 4 mg (has no administration in time range)     Initial Impression / Assessment and Plan / ED Course  I have reviewed the triage vital signs and the nursing notes.  Pertinent labs & imaging results that were available during my care of the patient were reviewed by me and considered in my medical decision making (see chart for details).    Has tick bite, no other source other than GI sx - mild tachy but not septic appearing, otherwise healthy pt.  Has had tick borne illness in past - states it feels similar.   Gastrointestinal illness, viral versus other however the patient has had a recent tick bite which makes it important to treat for potential tickborne illness such as [facility] spotted fever, ehrlichiosis, this does not appear to be a rash consistent with erythema migrans making Lyme's disease less likely.  Patient is agreeable to the plan.  Given Toradol, Zofran, home with doxycycline    Final Clinical Impressions(s) / ED  Diagnoses   Final diagnoses:  Tick bite, initial encounter  Nausea vomiting and diarrhea    ED Discharge Orders        Ordered    doxycycline (VIBRAMYCIN) 100 MG capsule  2 times daily     03/14/18 1636    ondansetron (ZOFRAN ODT) 4 MG disintegrating tablet  Every 8 hours PRN     03/14/18 1636       Eber Hong, MD 03/14/18 1636

## 2018-03-14 NOTE — ED Triage Notes (Addendum)
Pt reports chills, leg cramping, and generalized body aches that began today. Also endorses v/d. Pt states he was bit by a tick on inner RT upper leg 2-3 weeks ago.
